# Patient Record
Sex: Female | Born: 2000 | Race: White | Hispanic: No | Smoking: Never smoker
Health system: Southern US, Community
[De-identification: ages and names within clinical notes are randomized; demographics above are authoritative.]

## PROBLEM LIST (undated history)

## (undated) DIAGNOSIS — D649 Anemia, unspecified: Secondary | ICD-10-CM

## (undated) DIAGNOSIS — O4440 Low lying placenta NOS or without hemorrhage, unspecified trimester: Secondary | ICD-10-CM

## (undated) DIAGNOSIS — F4010 Social phobia, unspecified: Secondary | ICD-10-CM

## (undated) DIAGNOSIS — K219 Gastro-esophageal reflux disease without esophagitis: Secondary | ICD-10-CM

## (undated) HISTORY — DX: Anemia, unspecified: D64.9

## (undated) HISTORY — DX: Social phobia, unspecified: F40.10

## (undated) HISTORY — PX: ADENOIDECTOMY AND MYRINGOTOMY WITH TUBE PLACEMENT: SHX5714

## (undated) HISTORY — DX: Gastro-esophageal reflux disease without esophagitis: K21.9

---
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
## 2021-01-15 DIAGNOSIS — Z3482 Encounter for supervision of other normal pregnancy, second trimester: Secondary | ICD-10-CM | POA: Diagnosis not present

## 2021-01-21 ENCOUNTER — Encounter: Payer: Self-pay | Admitting: Obstetrics and Gynecology

## 2021-01-21 ENCOUNTER — Observation Stay
Admission: EM | Admit: 2021-01-21 | Discharge: 2021-01-21 | Disposition: A | Discharge status: Home / Self Care | Payer: BC Managed Care – PPO | Attending: Obstetrics and Gynecology | Admitting: Obstetrics and Gynecology

## 2021-01-21 ENCOUNTER — Other Ambulatory Visit: Payer: Self-pay

## 2021-01-21 DIAGNOSIS — O09213 Supervision of pregnancy with history of pre-term labor, third trimester: Secondary | ICD-10-CM | POA: Diagnosis not present

## 2021-01-21 DIAGNOSIS — Z8616 Personal history of COVID-19: Secondary | ICD-10-CM | POA: Insufficient documentation

## 2021-01-21 DIAGNOSIS — O99013 Anemia complicating pregnancy, third trimester: Secondary | ICD-10-CM | POA: Diagnosis not present

## 2021-01-21 DIAGNOSIS — O4703 False labor before 37 completed weeks of gestation, third trimester: Secondary | ICD-10-CM | POA: Diagnosis present

## 2021-01-21 DIAGNOSIS — O26893 Other specified pregnancy related conditions, third trimester: Principal | ICD-10-CM | POA: Insufficient documentation

## 2021-01-21 DIAGNOSIS — Z3A28 28 weeks gestation of pregnancy: Secondary | ICD-10-CM | POA: Insufficient documentation

## 2021-01-21 DIAGNOSIS — Z8751 Personal history of pre-term labor: Secondary | ICD-10-CM | POA: Diagnosis not present

## 2021-01-21 DIAGNOSIS — O23593 Infection of other part of genital tract in pregnancy, third trimester: Secondary | ICD-10-CM | POA: Diagnosis not present

## 2021-01-21 LAB — URINALYSIS, COMPLETE (UACMP) WITH MICROSCOPIC
Bilirubin Urine: NEGATIVE
Glucose, UA: NEGATIVE mg/dL
Hgb urine dipstick: NEGATIVE
Leukocytes,Ua: NEGATIVE
Nitrite: NEGATIVE
Protein, ur: NEGATIVE mg/dL
Specific Gravity, Urine: 1.02 (ref 1.005–1.030)
pH: 7 (ref 5.0–8.0)

## 2021-01-21 LAB — CHLAMYDIA/NGC RT PCR (ARMC ONLY)
Chlamydia Tr: NOT DETECTED
N gonorrhoeae: NOT DETECTED

## 2021-01-21 LAB — WET PREP, GENITAL
Sperm: NONE SEEN
Trich, Wet Prep: NONE SEEN
Yeast Wet Prep HPF POC: NONE SEEN

## 2021-01-21 LAB — FETAL FIBRONECTIN: Fetal Fibronectin: NEGATIVE

## 2021-01-21 MED ORDER — METRONIDAZOLE 500 MG PO TABS: 500.0000 mg | ORAL_TABLET | Freq: Two times a day (BID) | ORAL | 0 refills | Status: DC

## 2021-01-21 MED ORDER — METRONIDAZOLE 500 MG PO TABS: 500.0000 mg | ORAL_TABLET | Freq: Two times a day (BID) | ORAL | Status: DC

## 2021-01-21 MED ORDER — ACETAMINOPHEN 325 MG PO TABS: 650.0000 mg | ORAL_TABLET | ORAL | Status: DC | PRN

## 2021-01-21 MED ORDER — METRONIDAZOLE 500 MG PO TABS
500.0000 mg | ORAL_TABLET | Freq: Two times a day (BID) | ORAL | Status: DC
  Administered 2021-01-21: 500 mg via ORAL
  Filled 2021-01-21: qty 1

## 2021-01-21 NOTE — Discharge Summary (Signed)
Nicole Mathews is a 20 y.o. female. She is at [redacted]w[redacted]d gestation. Patient's last menstrual period was 06/22/2020. Estimated Date of Delivery: 04/09/21  Prenatal care site: [facility]   Current pregnancy complicated by:  1. History of Preterm delivery at 36 weeks 2. Left adnexal tenderness at NOB- normal [place], no masses 3. COVID infection 11/2020  [place] for growth in 3rd trimester  Weekly NST at 36 weeks  4. Anemia pregnancy  01/15/2021 - hgb 10.8  Started on iron supplements   Chief complaint: c/o abdominal tightening that started last night and her mother states they are happening every 4-8 minutes but they are unsure if they are contractions. Pt denies VB, LOF and states positive FM.  S: Resting comfortably. no VB.no LOF,  Active fetal movement. 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   Supervision of normal first teen pregnancy 08/09/2018     Clinic [facility] Prenatal Labs Dating  10 wk [place] Blood type: O/Positive/-- (03/26 1500)  Genetic Screen NIPS:   Normal XX Antibody:Negative (03/26 1500) Anatomic [place]  complete, low lying placenta Resolved on follow up Rubella: 4.25 (03/26 1500) Varicella: non-immune GTT Early:   Not needed     28 wk:  137    RPR: Non Reactive (03/26 1500)  Rhogam Not needed HBsAg: Negative (03/26 1500)  TDaP vaccin    Past Surgical History:  Procedure Laterality Date   ADENOIDECTOMY AND MYRINGOTOMY WITH TUBE PLACEMENT Bilateral    age 70    No Known Allergies  Prior to Admission medications   Medication Sig Start Date End Date Taking? Authorizing  Provider  Prenatal Vit-Fe Fumarate-FA (PRENATAL MULTIVITAMIN) TABS tablet Take 1 tablet by mouth daily at 12 noon.   Yes [provider]      Social History: She  reports that she has never smoked. She has never used smokeless tobacco. She reports that she does not drink alcohol and does not use drugs.  Family History: family history includes Diabetes in her maternal grandmother; Endometriosis in her maternal aunt; Lung cancer (age of onset: 34) in her maternal grandfather.   Review of Systems: A full review of systems was performed and negative except as noted in the HPI.     O:  BP (!) 103/59   Pulse (!) 112   Temp 98.4 F (36.9 C) (Oral)   Resp 16   LMP 06/22/2020  Results for orders placed or performed during the hospital encounter of 01/21/21 (from the past 48 hour(s))  Wet prep, genital   Collection Time: 01/21/21  8:01 PM  Result Value Ref Range   Yeast Wet Prep HPF POC NONE SEEN NONE SEEN   Trich, Wet Prep NONE SEEN NONE SEEN   Clue Cells Wet Prep HPF POC PRESENT (A) NONE SEEN   WBC, Wet Prep HPF POC PRESENT (A) NONE SEEN   Sperm NONE SEEN   Fetal fibronectin   Collection Time: 01/21/21  8:01 PM  Result Value Ref Range   Fetal Fibronectin NEGATIVE NEGATIVE  Urinalysis, Complete w Microscopic   Collection Time: 01/21/21  8:01  PM  Result Value Ref Range   Color, Urine YELLOW (A) YELLOW   APPearance CLEAR (A) CLEAR   Specific Gravity, Urine 1.020 1.005 - 1.030   pH 7.0 5.0 - 8.0   Glucose, UA NEGATIVE NEGATIVE mg/dL   Hgb urine dipstick NEGATIVE NEGATIVE   Bilirubin Urine NEGATIVE NEGATIVE   Ketones, ur TRACE (A) NEGATIVE mg/dL   Protein, ur NEGATIVE NEGATIVE mg/dL   Nitrite NEGATIVE NEGATIVE   Leukocytes,Ua NEGATIVE NEGATIVE   RBC / HPF 0-5 0 - 5 RBC/hpf   WBC, UA 0-5 0 - 5 WBC/hpf   Bacteria, UA RARE (A) NONE SEEN   Squamous Epithelial / LPF 0-5 0 - 5   Mucus PRESENT    Hyaline Casts, UA PRESENT      Constitutional: NAD, AAOx3  HE/ENT:  extraocular movements grossly intact, moist mucous membranes CV: RRR PULM: nl respiratory effort, CTABL     Abd: gravid, non-tender, non-distended, soft      Ext: Non-tender, Nonedematous   Psych: mood appropriate, speech normal Pelvic:  SSE done: mod amount creamy yellow cervical mucus and vaginal discharge noted. Mild erythema, no bleeding but cervix appears friable.  SVE: cervix posterior, closed, but medium consistency.    TOCO: Irregular occasional UC noted with mild UI.   Fetal  monitoring: Cat I Appropriate for GA Baseline: 135bpm Variability: moderate Accelerations:  present x >2 Decelerations absent Time    A/P: 20 y.o. [redacted]w[redacted]d here for antenatal surveillance for preterm contractions  Principle Diagnosis:  preterm contractions, bacterial vaginosis  Preterm labor: not present. FFN Neg Fetal Wellbeing: Reassuring Cat 1 tracing with Reactive NST  Wet prep c/w BV, Flagyl Rx to pharmacy D/c home stable, precautions reviewed, follow-up as scheduled.    Randa Ngo, CNM 01/21/2021  9:15 PM

## 2021-01-21 NOTE — OB Triage Note (Signed)
Pt discharged to home and left unit ambulatory with her mom at side and all personal belongs. Pt to be driven home in private passenger vehicle. OB triage complete. Pt encouraged to keep all remaining outpatient visits and get meds from pharmacy.

## 2021-01-21 NOTE — OB Triage Note (Signed)
Pt is a 20yo G2P1 at [redacted]w[redacted]d that presents from ED with c/o abdominal tightening that started last night and her mother states they are happening every 4-8 minutes but they are unsure if they are contractions. Pt denies VB, LOF and states positive FM. EFM applied and and intial fht 155. Pt states she had a preterm delivery at 36w with her previous pregnancy and is unsure of what ctx feel like because she had an epidural early.

## 2021-02-10 ENCOUNTER — Observation Stay
Admission: EM | Admit: 2021-02-10 | Discharge: 2021-02-10 | Disposition: A | Discharge status: Home / Self Care | Payer: BC Managed Care – PPO | Attending: Obstetrics and Gynecology | Admitting: Obstetrics and Gynecology

## 2021-02-10 DIAGNOSIS — Z3A31 31 weeks gestation of pregnancy: Secondary | ICD-10-CM | POA: Diagnosis not present

## 2021-02-10 DIAGNOSIS — O4703 False labor before 37 completed weeks of gestation, third trimester: Principal | ICD-10-CM | POA: Insufficient documentation

## 2021-02-10 DIAGNOSIS — O09213 Supervision of pregnancy with history of pre-term labor, third trimester: Secondary | ICD-10-CM | POA: Diagnosis not present

## 2021-02-10 LAB — URINALYSIS, COMPLETE (UACMP) WITH MICROSCOPIC
Bilirubin Urine: NEGATIVE
Glucose, UA: NEGATIVE mg/dL
Hgb urine dipstick: NEGATIVE
Ketones, ur: NEGATIVE mg/dL
Leukocytes,Ua: NEGATIVE
Nitrite: NEGATIVE
Protein, ur: NEGATIVE mg/dL
Specific Gravity, Urine: 1.001 — ABNORMAL LOW (ref 1.005–1.030)
pH: 8 (ref 5.0–8.0)

## 2021-02-10 MED ORDER — TERBUTALINE SULFATE 1 MG/ML IJ SOLN
0.2500 mg | Freq: Once | INTRAMUSCULAR | Status: AC
  Administered 2021-02-10: 0.25 mg via SUBCUTANEOUS
  Filled 2021-02-10: qty 1

## 2021-02-10 NOTE — Discharge Summary (Signed)
Patient ID: Nicole Mathews, female   DOB: August 30, 2000, 20 y.o.   MRN: [id number]   Subjective [] Expand by Default  Nicole Mathews is a 20 y.o. female. She is at [redacted]w[redacted]d gestation. Patient's last menstrual period was 06/22/2020. Estimated Date of Delivery: 04/09/21   Prenatal care site: [facility]   Chief complaint:ctx q 7-10 minutes  apart . No LOF , no vaginal bleeding  Started this am . + intercourse yesterday . PO intake good .  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      during puberty   Supervision of normal first teen pregnancy 08/09/2018      Clinic  [facility]            Prenatal Labs Dating     10 wk 08/11/2018        Blood type: O/Positive/-- (03/26 1500)  Genetic Screen     NIPS:   Normal XX    Antibody:Negative (03/26 1500) Anatomic 07-29-1999     complete, low lying placenta Resolved on follow up Rubella: 4.25 (03/26 1500) Varicella: non-immune GTT         Early:   Not needed     28 wk:  137              RPR: Non Reactive (03/26 1500)  Rhogam            Not needed           HBsAg: Negative (03/26 1500)  TDaP vaccin           Past Surgical History:  Procedure Laterality Date   ADENOIDECTOMY AND MYRINGOTOMY WITH TUBE PLACEMENT Bilateral      age 51      No Known Allergies          Prior to Admission medications   Medication Sig Start Date End Date Taking? Authorizing Provider  acetaminophen (TYLENOL) 325 MG tablet Take 2 tablets (650 mg total) by mouth every 4 (four) hours as needed (for pain scale < 4  OR  temperature  >/=  100.5 F). 01/21/21     McVey, 03/23/21 A, CNM  metroNIDAZOLE (FLAGYL) 500 MG  tablet Take 1 tablet (500 mg total) by mouth every 12 (twelve) hours. 01/22/21     McVey, 03/24/21, CNM  Prenatal Vit-Fe Fumarate-FA (PRENATAL MULTIVITAMIN) TABS tablet Take 1 tablet by mouth daily at 12 noon.       [provider]        Social History: She  reports that she has never smoked. She has never used smokeless tobacco. She reports that she does not drink alcohol and does not use drugs.   Family History: family history includes Diabetes in her maternal grandmother; Endometriosis in her maternal aunt; Lung cancer (age of onset: 54) in her maternal grandfather.  no history of gyn cancers   Review of Systems: A full review of systems was performed and negative except as noted in the HPI.       O:  Objective   LMP 06/22/2020  No results found for this or any previous visit (from the past 48 hour(s)).     Constitutional: NAD, AAOx3  HE/ENT: extraocular movements grossly intact, moist mucous membranes CV: RRR PULM: nl respiratory effort, CTABL                                         Abd: gravid, non-tender, non-distended, soft                                                  Ext: Non-tender, Nonedmeatous                     Psych: mood appropriate, speech normal Pelvic cx closed / 30% / -1 station  NST:  Baseline: 150 Variability: moderate Accelerations present x >2 Decelerations absent Ctx 2 isolated in 45 min   Lab Results Last 24 Hours       Results for orders placed or performed during the hospital encounter of 02/10/21 (from the past 24 hour(s))  Urinalysis, Complete w Microscopic Urine, Clean Catch     Status: Abnormal    Collection Time: 02/10/21  6:22 PM  Result Value Ref Range    Color, Urine STRAW (A) YELLOW    APPearance CLEAR (A) CLEAR    Specific Gravity, Urine 1.001 (L) 1.005 - 1.030    pH 8.0 5.0 - 8.0    Glucose, UA NEGATIVE NEGATIVE mg/dL    Hgb urine dipstick NEGATIVE NEGATIVE    Bilirubin Urine NEGATIVE NEGATIVE    Ketones, ur  NEGATIVE NEGATIVE mg/dL    Protein, ur NEGATIVE NEGATIVE mg/dL    Nitrite NEGATIVE NEGATIVE    Leukocytes,Ua NEGATIVE NEGATIVE    WBC, UA 0-5 0 - 5 WBC/hpf    Bacteria, UA RARE (A) NONE SEEN    Squamous Epithelial / LPF 0-5 0 - 5    Mucus PRESENT          Assessment: 20 y.o. [redacted]w[redacted]d here for antenatal surveillance during pregnancy.   Principle diagnosis: preterm ctx . No cervical dilation      Plan: SQ terb x 1 given  Labor: not present.  Fetal Wellbeing: Reassuring Cat 1 tracing. Reactive NST  D/c home stable, precautions reviewed, follow-up as scheduled.  RTC if >5 ctx / hr . Appt AT [facility] in 2 days . Cx check then    ----- T Wrenna Saks MD Attending Obstetrician and Gynecologist [facility], Department of OB/GYN [facility]

## 2021-02-10 NOTE — Progress Notes (Addendum)
Patient ID: ZEINAB RODWELL, female   DOB: 2000-08-29, 20 y.o.   MRN: [id number]  MARGART ZEMANEK is a 20 y.o. female. She is at [redacted]w[redacted]d gestation. Patient's last menstrual period was 06/22/2020. Estimated Date of Delivery: 04/09/21  Prenatal care site: [facility]   Chief complaint:ctx q 7-10 minutes  apart . No LOF , no vaginal bleeding  Started this am . + intercourse yesterday . PO intake good .  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   Supervision of normal first teen pregnancy 08/09/2018     Clinic [facility] Prenatal Labs Dating  10 wk [place] Blood type: O/Positive/-- (03/26 1500)  Genetic Screen NIPS:   Normal XX Antibody:Negative (03/26 1500) Anatomic [place]  complete, low lying placenta Resolved on follow up Rubella: 4.25 (03/26 1500) Varicella: non-immune GTT Early:   Not needed     28 wk:  137    RPR: Non Reactive (03/26 1500)  Rhogam Not needed HBsAg: Negative (03/26 1500)  TDaP vaccin    Past Surgical History:  Procedure Laterality Date   ADENOIDECTOMY AND MYRINGOTOMY WITH TUBE PLACEMENT Bilateral    age 58    No Known Allergies  Prior to Admission medications   Medication Sig Start Date End Date Taking? Authorizing Provider  acetaminophen (TYLENOL) 325 MG tablet Take 2 tablets (650 mg total) by mouth every 4 (four) hours as needed (for pain scale < 4  OR  temperature  >/=  100.5 F). 01/21/21   McVey, Lurena Joiner A, CNM  metroNIDAZOLE (FLAGYL) 500 MG tablet Take 1 tablet (500 mg total) by mouth every 12 (twelve) hours. 01/22/21   McVey, Prudencio Pair, CNM  Prenatal Vit-Fe Fumarate-FA (PRENATAL  MULTIVITAMIN) TABS tablet Take 1 tablet by mouth daily at 12 noon.    [provider]     Social History: She  reports that she has never smoked. She has never used smokeless tobacco. She reports that she does not drink alcohol and does not use drugs.  Family History: family history includes Diabetes in her maternal grandmother; Endometriosis in her maternal aunt; Lung cancer (age of onset: 45) in her maternal grandfather.  no history of gyn cancers  Review of Systems: A full review of systems was performed and negative except as noted in the HPI.     O:  LMP 06/22/2020  No results found for this or any previous visit (from the past 48 hour(s)).   Constitutional: NAD, AAOx3  HE/ENT: extraocular movements grossly intact, moist mucous membranes CV: RRR PULM: nl respiratory effort, CTABL     Abd: gravid, non-tender, non-distended, soft      Ext: Non-tender, Nonedmeatous   Psych: mood appropriate, speech normal Pelvic cx closed / 30% / -1 station  NST:  Baseline: 150 Variability: moderate Accelerations present x >2 Decelerations absent Ctx 2 isolated in 45 min  Results for orders placed or performed during the hospital encounter of 02/10/21 (from the past 24 hour(s))  Urinalysis, Complete w Microscopic Urine, Clean Catch     Status: Abnormal   Collection Time: 02/10/21  6:22 PM  Result Value Ref Range   Color, Urine STRAW (A) YELLOW   APPearance CLEAR (A) CLEAR   Specific Gravity, Urine 1.001 (L) 1.005 - 1.030   pH 8.0 5.0 - 8.0   Glucose, UA NEGATIVE NEGATIVE mg/dL   Hgb urine dipstick NEGATIVE NEGATIVE   Bilirubin Urine NEGATIVE NEGATIVE   Ketones, ur NEGATIVE NEGATIVE mg/dL   Protein, ur NEGATIVE NEGATIVE mg/dL   Nitrite NEGATIVE NEGATIVE   Leukocytes,Ua NEGATIVE NEGATIVE   WBC, UA 0-5 0 - 5 WBC/hpf   Bacteria, UA RARE (A) NONE SEEN   Squamous Epithelial / LPF 0-5 0 - 5   Mucus PRESENT      Assessment: 20 y.o. [redacted]w[redacted]d here for antenatal surveillance during  pregnancy.  Principle diagnosis: preterm ctx . No cervical dilation    Plan: SQ terb x 1 given  Labor: not present.  Fetal Wellbeing: Reassuring Cat 1 tracing. Reactive NST  D/c home stable, precautions reviewed, follow-up as scheduled.  RTC if >5 ctx / hr . Appt AT [facility] in 2 days . Cx check then   ----- T Corban Kistler MD Attending Obstetrician and Gynecologist [facility], Department of OB/GYN [facility]

## 2021-02-10 NOTE — OB Triage Note (Signed)
Here for complaints of contractions . Denies bleeding or leaking of fluid. Dr. Feliberto Gottron in room  SVE done Cervix closed/30/-1.

## 2021-02-10 NOTE — OB Triage Note (Signed)
Discharge instructions, labor precautions, and follow-up care reviewed with patient and family member. All questions answered. Patient verbalized understanding. Discharged ambulatory off the unit.

## 2021-02-11 ENCOUNTER — Observation Stay
Admission: EM | Admit: 2021-02-11 | Discharge: 2021-02-11 | Disposition: A | Discharge status: Home / Self Care | Payer: BC Managed Care – PPO | Attending: Obstetrics | Admitting: Obstetrics

## 2021-02-11 ENCOUNTER — Other Ambulatory Visit: Payer: Self-pay

## 2021-02-11 ENCOUNTER — Encounter: Payer: Self-pay | Admitting: Obstetrics and Gynecology

## 2021-02-11 ENCOUNTER — Telehealth: Payer: Self-pay

## 2021-02-11 DIAGNOSIS — Z3490 Encounter for supervision of normal pregnancy, unspecified, unspecified trimester: Secondary | ICD-10-CM

## 2021-02-11 DIAGNOSIS — O09213 Supervision of pregnancy with history of pre-term labor, third trimester: Secondary | ICD-10-CM | POA: Diagnosis not present

## 2021-02-11 DIAGNOSIS — O479 False labor, unspecified: Secondary | ICD-10-CM | POA: Diagnosis present

## 2021-02-11 DIAGNOSIS — Z3A31 31 weeks gestation of pregnancy: Secondary | ICD-10-CM | POA: Insufficient documentation

## 2021-02-11 MED ORDER — LACTATED RINGERS IV BOLUS
1000.0000 mL | Freq: Once | INTRAVENOUS | Status: AC
  Administered 2021-02-11: 1000 mL via INTRAVENOUS

## 2021-02-11 NOTE — OB Triage Note (Signed)
[facility] with complaints of ctx. Pt is [redacted]w[redacted]d, G2P1. Pt states she has ctx with activity since last night. Pt states she is having lower abdominal pain and ctx that vary with intensity/when they occur. Pt has a hx of preterm labor. Pt rates pain 5/10. Pt denies LOF and vaginal bleeding. Pt states positive FM. Monitors applied and assessing. Initial FHT 150.

## 2021-02-11 NOTE — Telephone Encounter (Signed)
Transition Care Management Follow-up Telephone Call Date of discharge and from where: 02/11/2021-[facility] How have you been since you were released from the hospital? Patient stated she is doing ok but her contractions are starting again so she will go back to the ED to make sure everything is ok.  Any questions or concerns? No  Items Reviewed: Did the pt receive and understand the discharge instructions provided? Yes  Medications obtained and verified?  N/A Other? No  Any new allergies since your discharge? No  Dietary orders reviewed? N/A Do you have support at home? Yes   Home Care and Equipment/Supplies: Were home health services ordered? not applicable If so, what is the name of the agency? N/A  Has the agency set up a time to come to the patient's home? not applicable Were any new equipment or medical supplies ordered?  No What is the name of the medical supply agency? N/A Were you able to get the supplies/equipment? not applicable Do you have any questions related to the use of the equipment or supplies? No  Functional Questionnaire: (I = Independent and D = Dependent) ADLs: I  Bathing/Dressing- I  Meal Prep- I  Eating- I  Maintaining continence- I  Transferring/Ambulation- I  Managing Meds- I  Follow up appointments reviewed:  PCP Hospital f/u appt confirmed? No   Specialist Hospital f/u appt confirmed? Yes  Scheduled to see Dr. Feliberto Gottron on 02/12/2021 @ 2:30pm. Are transportation arrangements needed? No  If their condition worsens, is the pt aware to call PCP or go to the Emergency Dept.? Yes Was the patient provided with contact information for the PCP's office or ED? Yes Was to pt encouraged to call back with questions or concerns? Yes

## 2021-02-11 NOTE — OB Triage Note (Signed)
Pt discharged home in stable condition. RN provided discharge instructions to pt, including information on follow up care. Pt verbalized understanding and all questions answered at this time.

## 2021-02-11 NOTE — Discharge Instructions (Signed)

## 2021-02-11 NOTE — Discharge Summary (Addendum)
Nicole Mathews is a 20 y.o. female. She is at [redacted]w[redacted]d gestation. Patient's last menstrual period was 06/22/2020. Estimated Date of Delivery: 04/09/21  Prenatal care site: [facility]  Chief complaint: ctx q 5-8 mins apart  HPI: Nicole Mathews presents to L&D with complaints of ctx every 5- 8 mins, with increased intensity.  Factors complicating pregnancy: History of preterm delivery at 36 weeks Left sided adnexal tenderness at NOB  S: Resting comfortably.  no VB.no LOF,  Active fetal movement.   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    Past Surgical Hx:  has a past surgical history that includes Adenoidectomy and myringotomy with tube placement (Bilateral).   No Known Allergies   Prior to Admission medications   Medication Sig Start Date End Date Taking? Authorizing Provider  acetaminophen (TYLENOL) 325 MG tablet Take 2 tablets (650 mg total) by mouth every 4 (four) hours as needed (for pain scale < 4  OR  temperature  >/=  100.5 F). 01/21/21   McVey, Lurena Joiner A, CNM  metroNIDAZOLE (FLAGYL) 500 MG tablet Take 1 tablet (500 mg total) by mouth every 12 (twelve) hours. Patient not taking: Reported on 02/11/2021 01/22/21   McVey, Prudencio Pair, CNM  Prenatal Vit-Fe Fumarate-FA (PRENATAL MULTIVITAMIN) TABS tablet Take 1 tablet by mouth daily at 12 noon.    [provider]    Social History: She  reports that she has never smoked. She has never used smokeless tobacco. She reports that she does not drink alcohol and does not use drugs.  Family History: family history includes Diabetes in her  maternal grandmother; Endometriosis in her maternal aunt; Lung cancer (age of onset: 81) in her maternal grandfather. ,no history of gyn cancers  Review of Systems: A full review of systems was performed and negative except as noted in the HPI.    O:  BP 106/68 (BP Location: Left Arm)   Pulse (!) 115   Temp 98 F (36.7 C) (Oral)   Resp 14   LMP 06/22/2020  Results for orders placed or performed during the hospital encounter of 02/10/21 (from the past 48 hour(s))  Urinalysis, Complete w Microscopic Urine, Clean Catch   Collection Time: 02/10/21  6:22 PM  Result Value Ref Range   Color, Urine STRAW (A) YELLOW   APPearance CLEAR (A) CLEAR   Specific Gravity, Urine 1.001 (L) 1.005 - 1.030   pH 8.0 5.0 - 8.0   Glucose, UA NEGATIVE NEGATIVE mg/dL   Hgb urine dipstick NEGATIVE NEGATIVE   Bilirubin Urine NEGATIVE NEGATIVE   Ketones, ur NEGATIVE NEGATIVE mg/dL   Protein, ur NEGATIVE NEGATIVE mg/dL   Nitrite NEGATIVE NEGATIVE   Leukocytes,Ua NEGATIVE NEGATIVE   WBC, UA 0-5 0 - 5 WBC/hpf   Bacteria, UA RARE (A) NONE SEEN   Squamous Epithelial / LPF 0-5 0 - 5   Mucus PRESENT       Constitutional: NAD, AAOx3  HE/ENT: extraocular movements grossly intact, moist mucous membranes CV: RRR PULM: nl respiratory effort, CTABL Abd: gravid, non-tender, non-distended, soft  Ext: Non-tender, Nonedmeatous Psych: mood appropriate, speech normal Pelvic : deferred SVE: Dilation: Closed Effacement (%): 30 Station: -3 Exam by:: [employer]  Fetal Monitor: Baseline: 140 bpm Variability: moderate Accels: Present Decels: none Toco: irregular, every 5-8 minutes, irritability, palpating mild.   Category: I With midwife at bedside patient denied feeling contractions and states she feels better after receiving IV fluid.   Assessment: 20 y.o. [redacted]w[redacted]d here for antenatal surveillance during pregnancy.  Principle diagnosis: uterine contractions There were no encounter diagnoses.   Plan: 1  liter IV fluid bolus  Labor: not present.  Fetal Wellbeing: Reassuring Cat 1 tracing. Reactive NST  D/c home stable, precautions reviewed, follow-up as scheduled.   ----- Chari Manning CNM Certified Nurse Midwife [facility]

## 2021-02-12 ENCOUNTER — Telehealth: Payer: Self-pay | Admitting: *Deleted

## 2021-02-12 NOTE — Telephone Encounter (Signed)
Transition Care Management Follow-up Telephone Call Date of discharge and from where: 02/11/2021 [facility] How have you been since you were released from the hospital? "Doing okay" Any questions or concerns? No  Items Reviewed: Did the pt receive and understand the discharge instructions provided? Yes  Medications obtained and verified? Yes  Other? No  Any new allergies since your discharge? No  Dietary orders reviewed? No Do you have support at home? Yes    Functional Questionnaire: (I = Independent and D = Dependent) ADLs: I  Bathing/Dressing- I  Meal Prep- I  Eating- I  Maintaining continence- I  Transferring/Ambulation- I  Managing Meds- I  Follow up appointments reviewed:  PCP Hospital f/u appt confirmed? No   Specialist Hospital f/u appt confirmed? No   Are transportation arrangements needed? No  If their condition worsens, is the pt aware to call PCP or go to the Emergency Dept.? Yes Was the patient provided with contact information for the PCP's office or ED? Yes Was to pt encouraged to call back with questions or concerns? Yes

## 2021-03-03 ENCOUNTER — Other Ambulatory Visit: Payer: Self-pay | Admitting: Certified Nurse Midwife

## 2021-03-03 ENCOUNTER — Other Ambulatory Visit: Payer: Self-pay

## 2021-03-03 ENCOUNTER — Observation Stay
Admission: EM | Admit: 2021-03-03 | Discharge: 2021-03-03 | Disposition: A | Discharge status: Home / Self Care | Payer: BC Managed Care – PPO | Attending: Certified Nurse Midwife | Admitting: Certified Nurse Midwife

## 2021-03-03 ENCOUNTER — Encounter: Payer: Self-pay | Admitting: Obstetrics and Gynecology

## 2021-03-03 DIAGNOSIS — Z3A34 34 weeks gestation of pregnancy: Secondary | ICD-10-CM | POA: Insufficient documentation

## 2021-03-03 DIAGNOSIS — O4703 False labor before 37 completed weeks of gestation, third trimester: Principal | ICD-10-CM | POA: Insufficient documentation

## 2021-03-03 DIAGNOSIS — O479 False labor, unspecified: Secondary | ICD-10-CM | POA: Diagnosis present

## 2021-03-03 LAB — URINALYSIS, COMPLETE (UACMP) WITH MICROSCOPIC
Bilirubin Urine: NEGATIVE
Glucose, UA: NEGATIVE mg/dL
Hgb urine dipstick: NEGATIVE
Ketones, ur: NEGATIVE mg/dL
Leukocytes,Ua: NEGATIVE
Nitrite: NEGATIVE
Protein, ur: NEGATIVE mg/dL
Specific Gravity, Urine: 1.005 (ref 1.005–1.030)
WBC, UA: NONE SEEN WBC/hpf (ref 0–5)
pH: 7 (ref 5.0–8.0)

## 2021-03-03 LAB — FETAL FIBRONECTIN: Fetal Fibronectin: NEGATIVE

## 2021-03-03 MED ORDER — DOCUSATE SODIUM 100 MG PO CAPS
100.0000 mg | ORAL_CAPSULE | Freq: Every day | ORAL | Status: DC
Start: 2021-03-03 — End: 2021-03-04

## 2021-03-03 MED ORDER — ACETAMINOPHEN 325 MG PO TABS: 650.0000 mg | ORAL_TABLET | ORAL | Status: DC | PRN

## 2021-03-03 MED ORDER — LACTATED RINGERS IV BOLUS
1000.0000 mL | Freq: Once | INTRAVENOUS | Status: AC
  Administered 2021-03-03: 1000 mL via INTRAVENOUS

## 2021-03-03 MED ORDER — PRENATAL MULTIVITAMIN CH: 1.0000 | ORAL_TABLET | Freq: Every day | ORAL | Status: DC

## 2021-03-03 MED ORDER — CALCIUM CARBONATE ANTACID 500 MG PO CHEW: 2.0000 | CHEWABLE_TABLET | ORAL | Status: DC | PRN

## 2021-03-03 NOTE — OB Triage Note (Signed)
Discharge instructions, labor precautions, and follow-up care reviewed with patient and significant other. All questions answered. Patient verbalized understanding. Patient discharged ambulatory off the unit.

## 2021-03-03 NOTE — Discharge Summary (Signed)
Nicole Mathews is a 20 y.o. female. She is at [redacted]w[redacted]d gestation. Patient's last menstrual period was 06/22/2020. Estimated Date of Delivery: 04/09/21  Prenatal care site: [facility]   Current pregnancy complicated by:  History of preterm delivery at 36 weeks Left sided adnexal tenderness at [facility]  Chief complaint: contractions  She reports persistent contractions all week that are getting stronger tonight. She reports she drank "at least a gallon" of water today and was not very active today. She reports nothing is relieving her contractions.  S: Resting comfortably. CTX present, she is not wincing or breathing through the contractions, no VB.no LOF,  Active fetal movement. 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   Supervision of normal first teen pregnancy 08/09/2018     Clinic [facility] Prenatal Labs Dating  10 wk [place] Blood type: O/Positive/-- (03/26 1500)  Genetic Screen NIPS:   Normal XX Antibody:Negative (03/26 1500) Anatomic [place]  complete, low lying placenta Resolved on follow up Rubella: 4.25 (03/26 1500) Varicella: non-immune GTT Early:   Not needed     28 wk:  137    RPR: Non Reactive (03/26 1500)  Rhogam Not needed HBsAg: Negative (03/26 1500)  TDaP vaccin    Past Surgical History:  Procedure Laterality Date   ADENOIDECTOMY AND MYRINGOTOMY WITH TUBE PLACEMENT Bilateral    age 26    No Known Allergies  Prior to Admission medications   Medication Sig Start Date End Date Taking? Authorizing Provider  ferrous sulfate 324 MG TBEC Take 324 mg by mouth.   Yes [provider]  Prenatal Vit-Fe Fumarate-FA (PRENATAL MULTIVITAMIN) TABS tablet Take 1 tablet by mouth daily at 12 noon.   Yes [provider]  acetaminophen (TYLENOL) 325 MG tablet Take 2 tablets (650 mg total) by mouth every 4 (four) hours as needed (for pain scale < 4  OR  temperature  >/=  100.5 F). Patient not taking: Reported on 03/03/2021 01/21/21   McVey, Prudencio Pair, CNM  metroNIDAZOLE (FLAGYL) 500 MG tablet Take 1 tablet (500 mg total) by mouth every 12 (twelve) hours. Patient not taking: No sig reported 01/22/21   McVey, Prudencio Pair, CNM      Social History: She  reports that she has never smoked. She has never used smokeless tobacco. She reports that she does not drink alcohol and does not use drugs.  Family History: family history includes Diabetes in her maternal grandmother; Endometriosis in her maternal aunt; Lung cancer (age of onset: 61) in her maternal grandfather.  no history of gyn cancers  Review of Systems: A full review of systems was performed and negative except as noted in the HPI.     O:  BP 108/68 (BP Location: Right Arm)   Pulse (!) 105   Temp 98.7 F (37.1 C) (Oral)   Resp 16   Ht 5\' 1"  (1.549 m)   Wt 54.4 kg   LMP 06/22/2020   BMI 22.67 kg/m  Results for orders placed or performed during the hospital encounter of 03/03/21 (from the past 48 hour(s))  Urinalysis,  Complete w Microscopic Urine, Clean Catch   Collection Time: 03/03/21  7:21 PM  Result Value Ref Range   Color, Urine COLORLESS (A) YELLOW   APPearance CLEAR (A) CLEAR   Specific Gravity, Urine 1.005 1.005 - 1.030   pH 7.0 5.0 - 8.0   Glucose, UA NEGATIVE NEGATIVE mg/dL   Hgb urine dipstick NEGATIVE NEGATIVE   Bilirubin Urine NEGATIVE NEGATIVE   Ketones, ur NEGATIVE NEGATIVE mg/dL   Protein, ur NEGATIVE NEGATIVE mg/dL   Nitrite NEGATIVE NEGATIVE   Leukocytes,Ua NEGATIVE NEGATIVE   RBC / HPF 0-5 0 - 5 RBC/hpf   WBC, UA NONE SEEN 0 - 5 WBC/hpf   Bacteria, UA RARE (A) NONE SEEN    Squamous Epithelial / LPF 0-5 0 - 5   Hyaline Casts, UA PRESENT   Fetal fibronectin   Collection Time: 03/03/21  7:24 PM  Result Value Ref Range   Fetal Fibronectin NEGATIVE NEGATIVE      Constitutional: NAD, AAOx3  HE/ENT: extraocular movements grossly intact, moist mucous membranes CV: RRR PULM: nl respiratory effort, CTABL     Abd: gravid, non-tender, non-distended, soft      Ext: Non-tender, Nonedematous   Psych: mood appropriate, speech normal Pelvic: SVE closed/50%/-3  Pelvic exam: normal external genitalia, vulva, vagina, cervix, uterus and adnexa.  Fetal  monitoring: Cat 1 Appropriate for GA Baseline: 135bpm Variability: moderate Accelerations: present x >2 Decelerations absent Time Contractions: 5-8 minutes with uterine irritability noted   A/P: 20 y.o. [redacted]w[redacted]d here for antenatal surveillance for uterine contractions  Principle Diagnosis:  Normal pregnancy in third trimester  Labor: not present, contractions spaced out to 7-5min apart, irritability decreased with IVF bolus, FFN negative, cervix closed.  Fetal Wellbeing: Reassuring Cat 1 tracing. Reactive NST  Increase hydration and rest when home Reviewed preterm labor precautions D/c home stable, precautions reviewed, follow-up as scheduled.    [name], CNM 03/03/2021 10:06 PM

## 2021-03-03 NOTE — OB Triage Note (Signed)
Contractions since Saturday, irregular, less than 6 an hour. Denies vaginal bleeding, LOF, increase in vaginal discharge. Reports + fetal movement. Denies urinary complaints. Elaina Hoops

## 2021-03-04 ENCOUNTER — Telehealth: Payer: Self-pay

## 2021-03-04 NOTE — Telephone Encounter (Signed)
Transition Care Management Unsuccessful Follow-up Telephone Call  Date of discharge and from where:  03/03/2021 from [facility]  Attempts:  1st Attempt  Reason for unsuccessful TCM follow-up call:  Left voice message    

## 2021-03-05 NOTE — Telephone Encounter (Signed)
Transition Care Management Unsuccessful Follow-up Telephone Call  Date of discharge and from where:  03/03/2021 from [facility]  Attempts:  2nd Attempt  Reason for unsuccessful TCM follow-up call:  Left voice message    

## 2021-03-06 NOTE — Telephone Encounter (Signed)
Transition Care Management Follow-up Telephone Call Date of discharge and from where: 03/03/2021 from [facility] How have you been since you were released from the hospital? Pt stated that she is feeling good. Pt did not have any questions or concerns at this time.  Any questions or concerns? No  Items Reviewed: Did the pt receive and understand the discharge instructions provided? Yes  Medications obtained and verified? Yes  Other? No  Any new allergies since your discharge? No  Dietary orders reviewed? No Do you have support at home? Yes   Functional Questionnaire: (I = Independent and D = Dependent) ADLs: I  Bathing/Dressing- I  Meal Prep- I  Eating- I  Maintaining continence- I  Transferring/Ambulation- I  Managing Meds- I   Follow up appointments reviewed:  PCP Hospital f/u appt confirmed? No  Specialist Hospital f/u appt confirmed? Yes  Scheduled to see Jennell Corner, MD on 03/15/2021 @ 1:30pm. Are transportation arrangements needed? No  If their condition worsens, is the pt aware to call PCP or go to the Emergency Dept.? Yes Was the patient provided with contact information for the PCP's office or ED? Yes Was to pt encouraged to call back with questions or concerns? Yes

## 2021-03-10 ENCOUNTER — Inpatient Hospital Stay: Payer: BC Managed Care – PPO | Admitting: Anesthesiology

## 2021-03-10 ENCOUNTER — Encounter: Payer: Self-pay | Admitting: Obstetrics and Gynecology

## 2021-03-10 ENCOUNTER — Other Ambulatory Visit: Payer: Self-pay

## 2021-03-10 ENCOUNTER — Inpatient Hospital Stay
Admission: EM | Admit: 2021-03-10 | Discharge: 2021-03-12 | DRG: 807 | Disposition: A | Discharge status: Home / Self Care | Payer: BC Managed Care – PPO | Attending: Obstetrics | Admitting: Obstetrics

## 2021-03-10 ENCOUNTER — Observation Stay
Admission: EM | Admit: 2021-03-10 | Discharge: 2021-03-10 | Disposition: A | Discharge status: Home / Self Care | Payer: BC Managed Care – PPO | Source: Home / Self Care | Admitting: Obstetrics and Gynecology

## 2021-03-10 DIAGNOSIS — O479 False labor, unspecified: Secondary | ICD-10-CM | POA: Diagnosis present

## 2021-03-10 DIAGNOSIS — Z8616 Personal history of COVID-19: Secondary | ICD-10-CM

## 2021-03-10 DIAGNOSIS — Z20822 Contact with and (suspected) exposure to covid-19: Secondary | ICD-10-CM | POA: Diagnosis present

## 2021-03-10 DIAGNOSIS — O9902 Anemia complicating childbirth: Secondary | ICD-10-CM | POA: Diagnosis present

## 2021-03-10 DIAGNOSIS — Z3A35 35 weeks gestation of pregnancy: Secondary | ICD-10-CM

## 2021-03-10 DIAGNOSIS — O4703 False labor before 37 completed weeks of gestation, third trimester: Secondary | ICD-10-CM | POA: Insufficient documentation

## 2021-03-10 DIAGNOSIS — D509 Iron deficiency anemia, unspecified: Secondary | ICD-10-CM | POA: Diagnosis present

## 2021-03-10 LAB — URINALYSIS, ROUTINE W REFLEX MICROSCOPIC
Bilirubin Urine: NEGATIVE
Glucose, UA: NEGATIVE mg/dL
Hgb urine dipstick: NEGATIVE
Ketones, ur: NEGATIVE mg/dL
Leukocytes,Ua: NEGATIVE
Nitrite: NEGATIVE
Protein, ur: NEGATIVE mg/dL
Specific Gravity, Urine: 1.003 — ABNORMAL LOW (ref 1.005–1.030)
pH: 7 (ref 5.0–8.0)

## 2021-03-10 LAB — CBC
HCT: 36.8 % (ref 36.0–46.0)
Hemoglobin: 12.9 g/dL (ref 12.0–15.0)
MCH: 32.7 pg (ref 26.0–34.0)
MCHC: 35.1 g/dL (ref 30.0–36.0)
MCV: 93.2 fL (ref 80.0–100.0)
Platelets: 170 10*3/uL (ref 150–400)
RBC: 3.95 MIL/uL (ref 3.87–5.11)
RDW: 12.9 % (ref 11.5–15.5)
WBC: 11.4 10*3/uL — ABNORMAL HIGH (ref 4.0–10.5)
nRBC: 0 % (ref 0.0–0.2)

## 2021-03-10 LAB — RESP PANEL BY RT-PCR (FLU A&B, COVID) ARPGX2
Influenza A by PCR: NEGATIVE
Influenza B by PCR: NEGATIVE
SARS Coronavirus 2 by RT PCR: NEGATIVE

## 2021-03-10 LAB — GROUP B STREP BY PCR: Group B strep by PCR: NEGATIVE

## 2021-03-10 LAB — WET PREP, GENITAL
Clue Cells Wet Prep HPF POC: NONE SEEN
Sperm: NONE SEEN
Trich, Wet Prep: NONE SEEN
Yeast Wet Prep HPF POC: NONE SEEN

## 2021-03-10 MED ORDER — PRENATAL MULTIVITAMIN CH: 1.0000 | ORAL_TABLET | Freq: Every day | ORAL | Status: DC

## 2021-03-10 MED ORDER — PHENYLEPHRINE 40 MCG/ML (10ML) SYRINGE FOR IV PUSH (FOR BLOOD PRESSURE SUPPORT)
80.0000 ug | PREFILLED_SYRINGE | INTRAVENOUS | Status: DC | PRN
Start: 2021-03-10 — End: 2021-03-10

## 2021-03-10 MED ORDER — LACTATED RINGERS IV BOLUS
1000.0000 mL | Freq: Once | INTRAVENOUS | Status: AC
  Administered 2021-03-10: 1000 mL via INTRAVENOUS

## 2021-03-10 MED ORDER — AMPICILLIN SODIUM 2 G IJ SOLR
2.0000 g | Freq: Once | INTRAMUSCULAR | Status: AC
  Administered 2021-03-10: 2 g via INTRAVENOUS
  Filled 2021-03-10: qty 2000

## 2021-03-10 MED ORDER — FENTANYL CITRATE (PF) 100 MCG/2ML IJ SOLN
50.0000 ug | INTRAMUSCULAR | Status: DC | PRN
  Administered 2021-03-10: 100 ug via INTRAVENOUS
  Filled 2021-03-10: qty 2

## 2021-03-10 MED ORDER — SODIUM CHLORIDE 0.9 % IV SOLN: 1.0000 g | INTRAVENOUS | Status: DC

## 2021-03-10 MED ORDER — LACTATED RINGERS IV SOLN
500.0000 mL | Freq: Once | INTRAVENOUS | Status: AC
  Administered 2021-03-10: 500 mL via INTRAVENOUS

## 2021-03-10 MED ORDER — LACTATED RINGERS IV SOLN: INTRAVENOUS | Status: DC

## 2021-03-10 MED ORDER — LIDOCAINE HCL (PF) 1 % IJ SOLN: 30.0000 mL | INTRAMUSCULAR | Status: DC | PRN

## 2021-03-10 MED ORDER — OXYTOCIN BOLUS FROM INFUSION
333.0000 mL | Freq: Once | INTRAVENOUS | Status: AC
Start: 2021-03-10 — End: 2021-03-10
  Administered 2021-03-10: 333 mL via INTRAVENOUS

## 2021-03-10 MED ORDER — FENTANYL-BUPIVACAINE-NACL 0.5-0.125-0.9 MG/250ML-% EP SOLN
EPIDURAL | Status: DC | PRN
  Administered 2021-03-10: 12 mL/h via EPIDURAL

## 2021-03-10 MED ORDER — DIPHENHYDRAMINE HCL 25 MG PO CAPS: 25.0000 mg | ORAL_CAPSULE | Freq: Four times a day (QID) | ORAL | Status: DC | PRN

## 2021-03-10 MED ORDER — DOCUSATE SODIUM 100 MG PO CAPS
100.0000 mg | ORAL_CAPSULE | Freq: Two times a day (BID) | ORAL | Status: DC
Start: 2021-03-10 — End: 2021-03-13
  Administered 2021-03-10 – 2021-03-12 (×5): 100 mg via ORAL
  Filled 2021-03-10 (×5): qty 1

## 2021-03-10 MED ORDER — SOD CITRATE-CITRIC ACID 500-334 MG/5ML PO SOLN
30.0000 mL | ORAL | Status: DC | PRN
Start: 2021-03-10 — End: 2021-03-10

## 2021-03-10 MED ORDER — DIBUCAINE (PERIANAL) 1 % EX OINT
1.0000 "application " | TOPICAL_OINTMENT | CUTANEOUS | Status: DC | PRN
  Administered 2021-03-10: 1 via RECTAL
  Filled 2021-03-10 (×2): qty 28

## 2021-03-10 MED ORDER — OXYTOCIN 10 UNIT/ML IJ SOLN
INTRAMUSCULAR | Status: AC
  Filled 2021-03-10: qty 2

## 2021-03-10 MED ORDER — WITCH HAZEL-GLYCERIN EX PADS
1.0000 | MEDICATED_PAD | CUTANEOUS | Status: DC
Start: 2021-03-10 — End: 2021-03-13
  Administered 2021-03-10: 1 via TOPICAL
  Filled 2021-03-10 (×2): qty 100

## 2021-03-10 MED ORDER — OXYTOCIN-SODIUM CHLORIDE 30-0.9 UT/500ML-% IV SOLN
2.5000 [IU]/h | INTRAVENOUS | Status: DC
Start: 2021-03-10 — End: 2021-03-13
  Administered 2021-03-10: 2.5 [IU]/h via INTRAVENOUS
  Filled 2021-03-10: qty 1000

## 2021-03-10 MED ORDER — MISOPROSTOL 200 MCG PO TABS
ORAL_TABLET | ORAL | Status: AC
  Filled 2021-03-10: qty 4

## 2021-03-10 MED ORDER — EPHEDRINE 5 MG/ML INJ: 10.0000 mg | INTRAVENOUS | Status: DC | PRN

## 2021-03-10 MED ORDER — ONDANSETRON HCL 4 MG/2ML IJ SOLN: 4.0000 mg | Freq: Four times a day (QID) | INTRAMUSCULAR | Status: DC | PRN

## 2021-03-10 MED ORDER — PRENATAL MULTIVITAMIN CH
1.0000 | ORAL_TABLET | Freq: Every day | ORAL | Status: DC
  Administered 2021-03-11 – 2021-03-12 (×2): 1 via ORAL
  Filled 2021-03-10 (×2): qty 1

## 2021-03-10 MED ORDER — FENTANYL-BUPIVACAINE-NACL 0.5-0.125-0.9 MG/250ML-% EP SOLN: 12.0000 mL/h | EPIDURAL | Status: DC | PRN

## 2021-03-10 MED ORDER — ACETAMINOPHEN 325 MG PO TABS: 650.0000 mg | ORAL_TABLET | Freq: Four times a day (QID) | ORAL | Status: DC | PRN

## 2021-03-10 MED ORDER — LACTATED RINGERS IV SOLN: 500.0000 mL | INTRAVENOUS | Status: DC | PRN

## 2021-03-10 MED ORDER — LIDOCAINE HCL (PF) 1 % IJ SOLN
INTRAMUSCULAR | Status: AC
  Filled 2021-03-10: qty 30

## 2021-03-10 MED ORDER — SIMETHICONE 80 MG PO CHEW: 80.0000 mg | CHEWABLE_TABLET | ORAL | Status: DC | PRN

## 2021-03-10 MED ORDER — IBUPROFEN 600 MG PO TABS
600.0000 mg | ORAL_TABLET | Freq: Four times a day (QID) | ORAL | Status: DC
  Administered 2021-03-10 – 2021-03-12 (×9): 600 mg via ORAL
  Filled 2021-03-10 (×10): qty 1

## 2021-03-10 MED ORDER — PHENYLEPHRINE 40 MCG/ML (10ML) SYRINGE FOR IV PUSH (FOR BLOOD PRESSURE SUPPORT): 80.0000 ug | PREFILLED_SYRINGE | INTRAVENOUS | Status: DC | PRN

## 2021-03-10 MED ORDER — COCONUT OIL OIL
1.0000 "application " | TOPICAL_OIL | Status: DC | PRN
  Filled 2021-03-10: qty 120

## 2021-03-10 MED ORDER — DIPHENHYDRAMINE HCL 50 MG/ML IJ SOLN: 12.5000 mg | INTRAMUSCULAR | Status: DC | PRN

## 2021-03-10 MED ORDER — FENTANYL-BUPIVACAINE-NACL 0.5-0.125-0.9 MG/250ML-% EP SOLN
EPIDURAL | Status: AC
  Filled 2021-03-10: qty 250

## 2021-03-10 MED ORDER — CALCIUM CARBONATE ANTACID 500 MG PO CHEW: 2.0000 | CHEWABLE_TABLET | ORAL | Status: DC | PRN

## 2021-03-10 MED ORDER — ACETAMINOPHEN 500 MG PO TABS: 1000.0000 mg | ORAL_TABLET | Freq: Four times a day (QID) | ORAL | Status: DC | PRN

## 2021-03-10 MED ORDER — LIDOCAINE-EPINEPHRINE (PF) 1.5 %-1:200000 IJ SOLN
INTRAMUSCULAR | Status: DC | PRN
  Administered 2021-03-10: 3 mL via PERINEURAL

## 2021-03-10 MED ORDER — BUPIVACAINE HCL (PF) 0.25 % IJ SOLN
INTRAMUSCULAR | Status: DC | PRN
  Administered 2021-03-10: 4 mL via EPIDURAL
  Administered 2021-03-10: 2 mL via EPIDURAL

## 2021-03-10 MED ORDER — BENZOCAINE-MENTHOL 20-0.5 % EX AERO
1.0000 "application " | INHALATION_SPRAY | CUTANEOUS | Status: DC | PRN
  Administered 2021-03-10: 1 via TOPICAL
  Filled 2021-03-10 (×2): qty 56

## 2021-03-10 MED ORDER — ONDANSETRON HCL 4 MG PO TABS: 4.0000 mg | ORAL_TABLET | ORAL | Status: DC | PRN

## 2021-03-10 MED ORDER — ONDANSETRON HCL 4 MG/2ML IJ SOLN: 4.0000 mg | INTRAMUSCULAR | Status: DC | PRN

## 2021-03-10 MED ORDER — AMMONIA AROMATIC IN INHA
RESPIRATORY_TRACT | Status: AC
  Filled 2021-03-10: qty 10

## 2021-03-10 MED ORDER — ACETAMINOPHEN 500 MG PO TABS
1000.0000 mg | ORAL_TABLET | Freq: Four times a day (QID) | ORAL | Status: DC | PRN
  Administered 2021-03-11 – 2021-03-12 (×5): 1000 mg via ORAL
  Filled 2021-03-10 (×6): qty 2

## 2021-03-10 MED ORDER — ACETAMINOPHEN 325 MG PO TABS
650.0000 mg | ORAL_TABLET | ORAL | Status: DC | PRN
  Administered 2021-03-10: 650 mg via ORAL
  Filled 2021-03-10: qty 2

## 2021-03-10 NOTE — Discharge Instructions (Signed)
Vaginal Delivery, Care After Refer to this sheet in the next few weeks. These discharge instructions provide you with information on caring for yourself after delivery. Your caregiver may also give you specific instructions. Your treatment has been planned according to the most current medical practices available, but problems sometimes occur. Call your caregiver if you have any problems or questions after you go home. HOME CARE INSTRUCTIONS Take over-the-counter or prescription medicines only as directed by your caregiver or pharmacist. Do not drink alcohol, especially if you are breastfeeding or taking medicine to relieve pain. Do not smoke tobacco. Continue to use good perineal care. Good perineal care includes: Wiping your perineum from back to front Keeping your perineum clean. You can do sitz baths twice a day, to help keep this area clean Do not use tampons, douche or have sex until your caregiver says it is okay. Shower only and avoid sitting in submerged water, aside from sitz baths Wear a well-fitting bra that provides breast support. Eat healthy foods. Drink enough fluids to keep your urine clear or pale yellow. Eat high-fiber foods such as whole grain cereals and breads, brown rice, beans, and fresh fruits and vegetables every day. These foods may help prevent or relieve constipation. Avoid constipation with high fiber foods or medications, such as miralax or metamucil Follow your caregiver's recommendations regarding resumption of activities such as climbing stairs, driving, lifting, exercising, or traveling. Talk to your caregiver about resuming sexual activities. Resumption of sexual activities is dependent upon your risk of infection, your rate of healing, and your comfort and desire to resume sexual activity. Try to have someone help you with your household activities and your newborn for at least a few days after you leave the hospital. Rest as much as possible. Try to rest or  take a nap when your newborn is sleeping. Increase your activities gradually. Keep all of your scheduled postpartum appointments. It is very important to keep your scheduled follow-up appointments. At these appointments, your caregiver will be checking to make sure that you are healing physically and emotionally. SEEK MEDICAL CARE IF:  You are passing large clots from your vagina. Save any clots to show your caregiver. You have a foul smelling discharge from your vagina. You have trouble urinating. You are urinating frequently. You have pain when you urinate. You have a change in your bowel movements. You have increasing redness, pain, or swelling near your vaginal incision (episiotomy) or vaginal tear. You have pus draining from your episiotomy or vaginal tear. Your episiotomy or vaginal tear is separating. You have painful, hard, or reddened breasts. You have a severe headache. You have blurred vision or see spots. You feel sad or depressed. You have thoughts of hurting yourself or your newborn. You have questions about your care, the care of your newborn, or medicines. You are dizzy or light-headed. You have a rash. You have nausea or vomiting. You were breastfeeding and have not had a menstrual period within 12 weeks after you stopped breastfeeding. You are not breastfeeding and have not had a menstrual period by the 12th week after delivery. You have a fever. SEEK IMMEDIATE MEDICAL CARE IF:  You have persistent pain. You have chest pain. You have shortness of breath. You faint. You have leg pain. You have stomach pain. Your vaginal bleeding saturates two or more sanitary pads in 1 hour. MAKE SURE YOU:  Understand these instructions. Will watch your condition. Will get help right away if you are not doing well or   get worse. Document Released: 04/29/2000 Document Revised: 09/16/2013 Document Reviewed: 12/28/2011 ExitCare Patient Information 2015 [employer]. This  information is not intended to replace advice given to you by your health care provider. Make sure you discuss any questions you have with your health care provider.  Sitz Bath A sitz bath is a warm water bath taken in the sitting position. The water covers only the hips and butt (buttocks). We recommend using one that fits in the toilet, to help with ease of use and cleanliness. It may be used for either healing or cleaning purposes. Sitz baths are also used to relieve pain, itching, or muscle tightening (spasms). The water may contain medicine. Moist heat will help you heal and relax.  HOME CARE  Take 3 to 4 sitz baths a day. Fill the bathtub half-full with warm water. Sit in the water and open the drain a little. Turn on the warm water to keep the tub half-full. Keep the water running constantly. Soak in the water for 15 to 20 minutes. After the sitz bath, pat the affected area dry. GET HELP RIGHT AWAY IF: You get worse instead of better. Stop the sitz baths if you get worse. MAKE SURE YOU: Understand these instructions. Will watch your condition. Will get help right away if you are not doing well or get worse. Document Released: 06/09/2004 Document Revised: 01/25/2012 Document Reviewed: 08/30/2010 ExitCare Patient Information 2015 [employer]. This information is not intended to replace advice given to you by your health care provider. Make sure you discuss any questions you have with your health care provider.   

## 2021-03-10 NOTE — Anesthesia Procedure Notes (Signed)
Epidural Patient location during procedure: OB Start time: 03/10/2021 4:04 PM End time: 03/10/2021 4:11 PM  Staffing Anesthesiologist: Reed Breech, MD Resident/CRNA: Hezzie Bump, CRNA Performed: resident/CRNA   Preanesthetic Checklist Completed: patient identified, IV checked, site marked, risks and benefits discussed, surgical consent, monitors and equipment checked, pre-op evaluation and timeout performed  Epidural Patient position: sitting Prep: ChloraPrep Patient monitoring: heart rate, continuous pulse ox and blood pressure Approach: midline Location: L3-L4 Injection technique: LOR saline  Needle:  Needle type: Tuohy  Needle gauge: 17 G Needle length: 9 cm and 9 Needle insertion depth: 6.5 cm Catheter type: closed end flexible Catheter size: 19 Gauge Catheter at skin depth: 12 cm Test dose: negative and 1.5% lidocaine with Epi 1:200 K  Assessment Sensory level: T10 Events: blood not aspirated, injection not painful, no injection resistance, no paresthesia and negative IV test  Additional Notes 1 attempt Pt. Evaluated and documentation done after procedure finished. Patient identified. Risks/Benefits/Options discussed with patient including but not limited to bleeding, infection, nerve damage, paralysis, failed block, incomplete pain control, headache, blood pressure changes, nausea, vomiting, reactions to medication both or allergic, itching and postpartum back pain. Confirmed with bedside nurse the patient's most recent platelet count. Confirmed with patient that they are not currently taking any anticoagulation, have any bleeding history or any family history of bleeding disorders. Patient expressed understanding and wished to proceed. All questions were answered. Sterile technique was used throughout the entire procedure. Please see nursing notes for vital signs. Test dose was given through epidural catheter and negative prior to continuing to dose epidural or  start infusion. Warning signs of high block given to the patient including shortness of breath, tingling/numbness in hands, complete motor block, or any concerning symptoms with instructions to call for help. Patient was given instructions on fall risk and not to get out of bed. All questions and concerns addressed with instructions to call with any issues or inadequate analgesia.    Patient tolerated the insertion well without immediate complications.Reason for block:procedure for pain

## 2021-03-10 NOTE — OB Triage Note (Signed)
Pt discharged home in stable condition by provider. RN provided discharge instructions to pt, including information on when to come back. Pt verbalized understanding and all questions answered at this time.

## 2021-03-10 NOTE — OB Triage Note (Signed)
Nicole Mathews is a 20 y.o. female. She is at [redacted]w[redacted]d gestation. Patient's last menstrual period was 06/22/2020. Estimated Date of Delivery: 04/09/21  Prenatal care site: [facility]   Current pregnancy complicated by:  History of preterm delivery at 36 weeks Left sided adnexal tenderness at [facility]  Chief complaint: uterine contractions  She reports she had sex tonight around 2200 and has been having painful, frequent contractions since 2300. She is breathing through contractions and reports back pain with contractions. Denies leaking fluid, bloody show. Reports good fetal movement.  S: Resting comfortably. Frequent CTX, no VB.no LOF,  Active fetal movement.  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   Supervision of normal first teen pregnancy 08/09/2018     Clinic [facility] Prenatal Labs Dating  10 wk [place] Blood type: O/Positive/-- (03/26 1500)  Genetic Screen NIPS:   Normal XX Antibody:Negative (03/26 1500) Anatomic [place]  complete, low lying placenta Resolved on follow up Rubella: 4.25 (03/26 1500) Varicella: non-immune GTT Early:   Not needed     28 wk:  137    RPR: Non Reactive (03/26 1500)  Rhogam Not needed HBsAg: Negative (03/26 1500)  TDaP vaccin    Past Surgical History:  Procedure Laterality Date   ADENOIDECTOMY AND MYRINGOTOMY WITH TUBE PLACEMENT Bilateral    age 59    No Known Allergies  Prior to Admission medications   Medication Sig Start Date End Date Taking? Authorizing Provider  ferrous sulfate 324 MG TBEC Take 324 mg by mouth.   Yes [provider]   Prenatal Vit-Fe Fumarate-FA (PRENATAL MULTIVITAMIN) TABS tablet Take 1 tablet by mouth daily at 12 noon.   Yes [provider]  acetaminophen (TYLENOL) 325 MG tablet Take 2 tablets (650 mg total) by mouth every 4 (four) hours as needed (for pain scale < 4  OR  temperature  >/=  100.5 F). Patient not taking: Reported on 03/03/2021 01/21/21   McVey, Prudencio Pair, CNM  metroNIDAZOLE (FLAGYL) 500 MG tablet Take 1 tablet (500 mg total) by mouth every 12 (twelve) hours. Patient not taking: No sig reported 01/22/21   McVey, Prudencio Pair, CNM      Social History: She  reports that she has never smoked. She has never used smokeless tobacco. She reports that she does not drink alcohol and does not use drugs.  Family History: family history includes Diabetes in her maternal grandmother; Endometriosis in her maternal aunt; Lung cancer (age of onset: 49) in her maternal grandfather.  no history of gyn cancers  Review of Systems: A full review of systems was performed and negative except as noted in the HPI.     O:  BP 114/72 (BP Location: Left Arm)   Pulse (!) 107   Temp 98.1 F (36.7 C) (Oral)   Resp 14   LMP 06/22/2020  Results for orders placed or performed during the hospital encounter of 03/10/21 (from the past 48 hour(s))  Wet prep, genital   Collection Time: 03/10/21  3:58 AM  Result Value Ref Range   Yeast Wet Prep HPF POC  NONE SEEN NONE SEEN   Trich, Wet Prep NONE SEEN NONE SEEN   Clue Cells Wet Prep HPF POC NONE SEEN NONE SEEN   WBC, Wet Prep HPF POC FEW (A) NONE SEEN   Sperm NONE SEEN   Urinalysis, Routine w reflex microscopic   Collection Time: 03/10/21  3:58 AM  Result Value Ref Range   Color, Urine COLORLESS (A) YELLOW   APPearance CLEAR (A) CLEAR   Specific Gravity, Urine 1.003 (L) 1.005 - 1.030   pH 7.0 5.0 - 8.0   Glucose, UA NEGATIVE NEGATIVE mg/dL   Hgb urine dipstick NEGATIVE NEGATIVE   Bilirubin Urine NEGATIVE NEGATIVE   Ketones, ur NEGATIVE NEGATIVE mg/dL    Protein, ur NEGATIVE NEGATIVE mg/dL   Nitrite NEGATIVE NEGATIVE   Leukocytes,Ua NEGATIVE NEGATIVE      Constitutional: NAD, AAOx3  HE/ENT: extraocular movements grossly intact, moist mucous membranes CV: RRR PULM: nl respiratory effort, CTABL     Abd: gravid, non-tender, non-distended, soft      Ext: Non-tender, Nonedematous   Psych: mood appropriate, speech normal Pelvic: Cervical exam: 1.5/60%/-1  Pelvic exam: normal external genitalia, vulva, vagina, cervix, uterus and adnexa.  Fetal  monitoring: Cat 1  Baseline: 135bpm Variability: moderate Accelerations: present x >2 Decelerations absent Time 5hrs Contractions: q1-49min, lasting 40-60sec, palpate mild   A/P: 20 y.o. [redacted]w[redacted]d here for antenatal surveillance for uterine contractions  Principle Diagnosis:  Normal pregnancy in third trimester  Labor: not present. Uterine contractions present but spaced out and less intense after IVF and rest.  Fetal Wellbeing: Reassuring Cat 1 tracing. No vaginitis or UTI present. Advised no sex until 37wks to prevent preterm labor Encouraged resting and hydration Return for increasing contraction intervals, discomfort, or leaking fluid D/c home stable, precautions reviewed, follow-up as scheduled. Pt is agreeable with discharge and plan of care.   [name], CNM 03/10/2021 6:39 AM

## 2021-03-10 NOTE — Discharge Summary (Signed)
Obstetrical Discharge Summary  Patient Name: Nicole Mathews DOB: 02/13/2001 MRN: [id number]  Date of Admission: 03/10/2021 Date of Delivery: 03/10/2021 Delivered by: Nicole Mathews, CNM  Date of Discharge: 03/12/2021  [facility]: [facility] WVP:XTGGYIR'S last menstrual period was 06/22/2020. EDC Estimated Date of Delivery: 04/09/21 Gestational Age at Delivery: [redacted]w[redacted]d   Antepartum complications:  History of preterm birth at 21 weeks History of covid-19 infection 11/2020 Maternal iron deficiency anemia  Precipitous labor (current)  Admitting Diagnosis: Preterm labor [O60.00] Secondary Diagnosis: Patient Active Problem List   Diagnosis Date Noted   Preterm labor 03/10/2021   Supervision of normal pregnancy 09/24/2020    Augmentation: N/A Complications: None Intrapartum complications/course: Nicole Mathews, presented to L&D with worsening contractions.  She was monitored for a prolonged period earlier in the morning for irregular preterm contractions and discharged home after no cervical change.  Reports that contractions became more painful and consistent around 1300 and returned for evaluation.  She presented in active labor and progressed well to C/C/+1 with a bulging bag of water after receiving an epidural.  SROM for a moderate amount of clear fluid during pushing.  She pushed quickly over approximately 10 minutes for a spontaneous vaginal birth.  Delivery Type: spontaneous vaginal delivery Anesthesia: epidural Placenta: spontaneous Laceration: small vaginal abrasion, hemostatic - no repair  Episiotomy: none Newborn Data: Live born child "Nicole Mathews" Birth Weight: 4 lb 15.7 oz (2260 g) APGAR: 8, 8  Newborn Delivery   Birth date/time: 03/10/2021 16:51:00 Delivery type: Vaginal, Spontaneous     Postpartum Procedures:  Edinburgh:  Edinburgh Postnatal Depression Scale Screening Tool 03/11/2021 03/11/2021 03/10/2021 03/04/2019 01/22/2019  I have been able to laugh and see  the funny side of things. (No Data) 0 (No Data) 1 0  I have looked forward with enjoyment to things. - 0 - 1 0  I have blamed myself unnecessarily when things went wrong. - 0 - 3 1  I have been anxious or worried for no good reason. - 0 - 3 0  I have felt scared or panicky for no good reason. - 0 - 0 0  Things have been getting on top of me. - 0 - 2 1  I have been so unhappy that I have had difficulty sleeping. - 0 - 1 0  I have felt sad or miserable. - 0 - 2 0  I have been so unhappy that I have been crying. - 0 - 2 0  The thought of harming myself has occurred to me. - 0 - 0 0  Edinburgh Postnatal Depression Scale Total - 0 - 15 2     Post partum course:  Patient had an uncomplicated postpartum course.  By time of discharge on PPD#2, her pain was controlled on oral pain medications; she had appropriate lochia and was ambulating, voiding without difficulty and tolerating regular diet.  She was deemed stable for discharge to home.     Discharge Physical Exam:  BP 98/71 (BP Location: Right Arm)   Pulse 88   Temp 97.7 F (36.5 C) (Oral)   Resp 18   Ht 5\' 1"  (1.549 m)   Wt 54.4 kg   LMP 06/22/2020   SpO2 98%   Breastfeeding Unknown   BMI 22.67 kg/m   General: NAD CV: RRR Pulm: CTABL, nl effort ABD: s/nd/nt, fundus firm and below the umbilicus Lochia: moderate Perineum:minimal edema/ vaginal abrasion healing well DVT Evaluation: LE non-ttp, no evidence of DVT on exam.  Hemoglobin  Date Value Ref  Range Status  03/11/2021 10.7 (L) 12.0 - 15.0 g/dL Final  59/56/3875 64.3 (L) 11.1 - 15.9 g/dL Final   HCT  Date Value Ref Range Status  03/11/2021 29.9 (L) 36.0 - 46.0 % Final   Hematocrit  Date Value Ref Range Status  11/28/2018 31.0 (L) 34.0 - 46.6 % Final     Disposition: stable, discharge to home. Baby Feeding: breastmilk Baby Disposition: home with mom  Rh Immune globulin given: Rh pos Rubella vaccine given: Immune Varivax vaccine given: Immune Flu vaccine given  in AP or PP setting: declined  Tdap vaccine given in AP or PP setting: declined   Contraception: declines at this time, considering Nuvaring at a later date, patient understands breastfeeding mothers should use progestin only options. IUD, depo, Nexplanon, and POP's discussed  Prenatal Labs:  Blood type/Rh O pos  Antibody screen neg  Rubella Immune  Varicella Immune  RPR NR  HBsAg Neg  HIV NR  GC neg  Chlamydia neg  Genetic screening cfDNA negative, AFP neg  1 hour GTT 105  3 hour GTT N/A  GBS Negative     Plan:  Nicole Mathews was discharged to home in good condition. Follow-up appointment with delivering provider in 6 weeks.  Discharge Medications: Allergies as of 03/12/2021   Not on File      Medication List     STOP taking these medications    ferrous sulfate 324 MG Tbec Replaced by: ferrous sulfate 325 (65 FE) MG tablet       TAKE these medications    acetaminophen 500 MG tablet Commonly known as: TYLENOL Take 2 tablets (1,000 mg total) by mouth every 6 (six) hours as needed (for pain scale < 4).   benzocaine-Menthol 20-0.5 % Aero Commonly known as: DERMOPLAST Apply 1 application topically as needed for irritation (perineal discomfort).   coconut oil Oil Apply 1 application topically as needed.   dibucaine 1 % Oint Commonly known as: NUPERCAINAL Place 1 application rectally as needed for hemorrhoids.   docusate sodium 100 MG capsule Commonly known as: COLACE Take 1 capsule (100 mg total) by mouth 2 (two) times daily.   ferrous sulfate 325 (65 FE) MG tablet Take 1 tablet (325 mg total) by mouth 2 (two) times daily with a meal. Replaces: ferrous sulfate 324 MG Tbec   ibuprofen 600 MG tablet Commonly known as: ADVIL Take 1 tablet (600 mg total) by mouth every 6 (six) hours.   prenatal multivitamin Tabs tablet Take 1 tablet by mouth daily at 12 noon.   simethicone 80 MG chewable tablet Commonly known as: MYLICON Chew 1 tablet (80 mg  total) by mouth as needed for flatulence.   witch hazel-glycerin pad Commonly known as: TUCKS Apply 1 application topically continuous.         Follow-up Information     Nicole Mathews, CNM. Schedule an appointment as soon as possible for a visit in 6 week(s).   Specialty: Certified Nurse Midwife Why: postpartum visit Contact information: [address] [place] [address] [phone]                 Signed: Chari Manning CNM

## 2021-03-10 NOTE — OB Triage Note (Signed)
[facility] with complaints of contractions. Pt states ctx began around 2300 and are now about 3 mins a part. Pt states feeling back pain during contractions. Pt reports positive FM. Pt denies LOF and vaginal bleeding. Monitors applied and assessing. Initial FHT 155.

## 2021-03-10 NOTE — H&P (Signed)
OB History & Physical   History of Present Illness:   Chief Complaint: worsening contractions   HPI:  Nicole Mathews is a 20 y.o. G19P0101 female at [redacted]w[redacted]d dated by [place] at [redacted]w[redacted]d, not c/w LMP.  She presents to L&D for more painful contractions.  She presented to L&D earlier this morning for preterm contractions and was discharged after no cervical change.  Reports that contractions become worse around 1300 today and returned for evaluation.   Reports active fetal movement  Contractions: every 3 to 5 minutes LOF/SROM: denies  Vaginal bleeding: bloody show   Factors complicating pregnancy:  History of preterm birth at 31 weeks History of covid-19 infection 11/2020 Maternal iron deficiency anemia   Patient Active Problem List   Diagnosis Date Noted   Preterm labor 03/10/2021   Supervision of normal pregnancy 09/24/2020     Maternal Medical History:   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   Supervision of normal first teen pregnancy 08/09/2018     Clinic [facility] Prenatal Labs Dating  10 wk [place] Blood type: O/Positive/-- (03/26 1500)  Genetic Screen NIPS:   Normal XX Antibody:Negative (03/26 1500) Anatomic [place]  complete, low lying placenta Resolved on follow up Rubella: 4.25 (03/26 1500) Varicella: non-immune GTT Early:   Not needed     28 wk:  137    RPR: Non Reactive (03/26 1500)  Rhogam Not needed HBsAg: Negative (03/26 1500)  TDaP vaccin    Past Surgical History:  Procedure Laterality Date   ADENOIDECTOMY AND MYRINGOTOMY WITH TUBE PLACEMENT Bilateral    age 28    Not on File  Prior to Admission medications   Medication Sig Start Date End Date Taking? Authorizing  Provider  ferrous sulfate 324 MG TBEC Take 324 mg by mouth.   Yes [provider]  Prenatal Vit-Fe Fumarate-FA (PRENATAL MULTIVITAMIN) TABS tablet Take 1 tablet by mouth daily at 12 noon.   Yes [provider]  acetaminophen (TYLENOL) 325 MG tablet Take 2 tablets (650 mg total) by mouth every 4 (four) hours as needed (for pain scale < 4  OR  temperature  >/=  100.5 F). Patient not taking: Reported on 03/03/2021 01/21/21   McVey, Prudencio Pair, CNM  metroNIDAZOLE (FLAGYL) 500 MG tablet Take 1 tablet (500 mg total) by mouth every 12 (twelve) hours. Patient not taking: No sig reported 01/22/21   McVey, Prudencio Pair, CNM     Prenatal care site:  [facility]  Social History: She  reports that she has never smoked. She has never used smokeless tobacco. She reports that she does not drink alcohol and does not use drugs.  Family History: family history includes Diabetes in her maternal grandmother; Endometriosis in her maternal aunt; Lung cancer (age of onset: 64) in her maternal grandfather.   Review of Systems: A full review of systems was performed and negative except as noted in the HPI.     Physical Exam:  Vital Signs: BP 106/70 (BP Location: Left Arm)   Pulse (!) 102   Temp 98.3 F (36.8 C) (Oral)   Resp 16   Ht 5\' 1"  (1.549 m)   Wt  54.4 kg   LMP 06/22/2020   BMI 22.67 kg/m    General: no acute distress.  HEENT: normocephalic, atraumatic Heart: regular rate & rhythm.  No murmurs/rubs/gallops Lungs: clear to auscultation bilaterally, normal respiratory effort Abdomen: soft, gravid, non-tender;  EFW: 5lbs  Pelvic:   External: Normal external female genitalia  Cervix: Dilation: 6.5 / Effacement (%): 60 / Station: -1    Extremities: non-tender, symmetric, No edema bilaterally.  DTRs: 2+/2+  Neurologic: Alert & oriented x 3.    Results for orders placed or performed during the hospital encounter of 03/10/21 (from the past 24 hour(s))  Resp Panel by RT-PCR (Flu  A&B, Covid) Nasopharyngeal Swab     Status: None   Collection Time: 03/10/21  2:33 PM   Specimen: Nasopharyngeal Swab; Nasopharyngeal(NP) swabs in vial transport medium  Result Value Ref Range   SARS Coronavirus 2 by RT PCR NEGATIVE NEGATIVE   Influenza A by PCR NEGATIVE NEGATIVE   Influenza B by PCR NEGATIVE NEGATIVE  Group B strep by PCR     Status: None   Collection Time: 03/10/21  2:33 PM   Specimen: Vaginal/Rectal; Genital  Result Value Ref Range   Group B strep by PCR NEGATIVE NEGATIVE  CBC     Status: Abnormal   Collection Time: 03/10/21  2:45 PM  Result Value Ref Range   WBC 11.4 (H) 4.0 - 10.5 K/uL   RBC 3.95 3.87 - 5.11 MIL/uL   Hemoglobin 12.9 12.0 - 15.0 g/dL   HCT 16.1 09.6 - 04.5 %   MCV 93.2 80.0 - 100.0 fL   MCH 32.7 26.0 - 34.0 pg   MCHC 35.1 30.0 - 36.0 g/dL   RDW 40.9 81.1 - 91.4 %   Platelets 170 150 - 400 K/uL   nRBC 0.0 0.0 - 0.2 %  Type and screen     Status: None (Preliminary result)   Collection Time: 03/10/21  3:11 PM  Result Value Ref Range   ABO/RH(D) O POS    Antibody Screen POS    Sample Expiration      03/13/2021,2359 Performed at [facility], [address] [place], [place] [address]    Antibody Identification PENDING     Pertinent Results:  Prenatal Labs: Blood type/Rh O pos  Antibody screen neg  Rubella Immune  Varicella Immune  RPR NR  HBsAg Neg  HIV NR  GC neg  Chlamydia neg  Genetic screening cfDNA negative, AFP neg  1 hour GTT 105  3 hour GTT N/A  GBS Negative    FHT:  FHR: 145 bpm, variability: moderate,  accelerations:  Present,  decelerations:  Absent Category/reactivity:  Category I UC:   regular, every 2-4 minutes   Cephalic by Le[place] and SVE   No results found.  Assessment:  Nicole Mathews is a 20 y.o. G30P0101 female at [redacted]w[redacted]d with Preterm labor.   Plan:  1. Admit to Labor & Delivery; consents reviewed and obtained - Covid admission screen   2. Fetal Well being  - Fetal Tracing: cat 1 -  Group B Streptococcus ppx not indicated: GBS neg by PCR today - one dose of Ampicillin 2 grams given prior to known GBS status  - Presentation: cephalic confirmed by SVE   3. Routine OB: - Prenatal labs reviewed, as above - Rh pos - CBC, T&S, RPR on admit - Clear fluids, IVF  4. Monitoring of labor  - Contractions monitored with external toco - Pelvis proven to 5lbs 12oz, adequate for  trial of labor  - Plan for expectant management  - Augmentation with AROM as appropriate  - Plan for  continuous fetal monitoring - Maternal pain control as desired; planning regional anesthesia - Anticipate vaginal delivery  5. Post Partum Planning: - Infant feeding: Breast  - Contraception: TBD - Tdap vaccine: declined  - Flu vaccine: declined   Gustavo Lah, CNM 03/10/21 4:31 PM  Margaretmary Eddy, CNM Certified Nurse Midwife [facility]

## 2021-03-10 NOTE — Anesthesia Preprocedure Evaluation (Addendum)
Anesthesia Evaluation  Patient identified by MRN, date of birth, ID band Patient awake    Reviewed: Allergy & Precautions, H&P , NPO status , Patient's Chart, lab work & pertinent test results  Airway Mallampati: II  TM Distance: >3 FB Neck ROM: full    Dental no notable dental hx.    Pulmonary    Pulmonary exam normal        Cardiovascular negative cardio ROS Normal cardiovascular exam     Neuro/Psych Anxiety negative neurological ROS     GI/Hepatic Neg liver ROS, GERD  ,  Endo/Other  negative endocrine ROS  Renal/GU negative Renal ROS  negative genitourinary   Musculoskeletal   Abdominal   Peds  Hematology  (+) Blood dyscrasia, anemia ,   Anesthesia Other Findings 20 yo G2P0101 at 59 5/7 for labor epidural.  Reproductive/Obstetrics (+) Pregnancy                            Anesthesia Physical Anesthesia Plan  ASA: 2  Anesthesia Plan: Epidural   Post-op Pain Management:    Induction:   PONV Risk Score and Plan: 2 and Treatment may vary due to age or medical condition  Airway Management Planned:   Additional Equipment:   Intra-op Plan:   Post-operative Plan:   Informed Consent: I have reviewed the patients History and Physical, chart, labs and discussed the procedure including the risks, benefits and alternatives for the proposed anesthesia with the patient or authorized representative who has indicated his/her understanding and acceptance.       Plan Discussed with: CRNA and Anesthesiologist  Anesthesia Plan Comments:         Anesthesia Quick Evaluation

## 2021-03-10 NOTE — Discharge Instructions (Signed)

## 2021-03-11 LAB — CBC
HCT: 29.9 % — ABNORMAL LOW (ref 36.0–46.0)
Hemoglobin: 10.7 g/dL — ABNORMAL LOW (ref 12.0–15.0)
MCH: 33.1 pg (ref 26.0–34.0)
MCHC: 35.8 g/dL (ref 30.0–36.0)
MCV: 92.6 fL (ref 80.0–100.0)
Platelets: 144 10*3/uL — ABNORMAL LOW (ref 150–400)
RBC: 3.23 MIL/uL — ABNORMAL LOW (ref 3.87–5.11)
RDW: 13 % (ref 11.5–15.5)
WBC: 10.3 10*3/uL (ref 4.0–10.5)
nRBC: 0 % (ref 0.0–0.2)

## 2021-03-11 LAB — RPR: RPR Ser Ql: NONREACTIVE

## 2021-03-11 MED ORDER — FERROUS SULFATE 325 (65 FE) MG PO TABS
325.0000 mg | ORAL_TABLET | Freq: Two times a day (BID) | ORAL | Status: DC
  Administered 2021-03-11 – 2021-03-12 (×3): 325 mg via ORAL
  Filled 2021-03-11 (×4): qty 1

## 2021-03-11 MED ORDER — OXYCODONE HCL 5 MG PO TABS
5.0000 mg | ORAL_TABLET | Freq: Four times a day (QID) | ORAL | Status: DC | PRN
  Administered 2021-03-11 – 2021-03-12 (×5): 5 mg via ORAL
  Filled 2021-03-11 (×5): qty 1

## 2021-03-11 NOTE — Progress Notes (Signed)
Post Partum Day 1  Subjective: Doing well, no concerns. Ambulating without difficulty, pain managed with PO meds, tolerating regular diet, and voiding without difficulty.   No fever/chills, chest pain, shortness of breath, nausea/vomiting, or leg pain. No nipple or breast pain. No headache, visual changes, or RUQ/epigastric pain.  Objective: BP 94/61 (BP Location: Left Arm)   Pulse 97   Temp 98 F (36.7 C) (Oral)   Resp 18   Ht 5\' 1"  (1.549 m)   Wt 54.4 kg   LMP 06/22/2020   SpO2 98%   Breastfeeding Unknown   BMI 22.67 kg/m    Physical Exam:  General: alert and cooperative Breasts: soft/nontender CV: RRR Pulm: nl effort Abdomen: soft, non-tender Uterine Fundus: firm Incision: n/a Perineum: minimal edema, intact Lochia: appropriate DVT Evaluation: No evidence of DVT seen on physical exam. Edinburgh:  Edinburgh Postnatal Depression Scale Screening Tool 03/10/2021 03/04/2019 01/22/2019 01/21/2019  I have been able to laugh and see the funny side of things. (No Data) 1 0 (No Data)  I have looked forward with enjoyment to things. - 1 0 -  I have blamed myself unnecessarily when things went wrong. - 3 1 -  I have been anxious or worried for no good reason. - 3 0 -  I have felt scared or panicky for no good reason. - 0 0 -  Things have been getting on top of me. - 2 1 -  I have been so unhappy that I have had difficulty sleeping. - 1 0 -  I have felt sad or miserable. - 2 0 -  I have been so unhappy that I have been crying. - 2 0 -  The thought of harming myself has occurred to me. - 0 0 -  Edinburgh Postnatal Depression Scale Total - 15 2 -     Recent Labs    03/10/21 1445 03/11/21 0514  HGB 12.9 10.7*  HCT 36.8 29.9*  WBC 11.4* 10.3  PLT 170 144*    Assessment/Plan: 20 y.o. 03/13/21 postpartum day # 1  -Continue routine postpartum care -Lactation consult PRN for breastfeeding   -Acute blood loss anemia - hemodynamically stable and asymptomatic; start PO ferrous  sulfate BID with stool softeners  -Immunization status: Needs Tdap, and flu prior to discharge  Disposition: Continue inpatient postpartum care    LOS: 1 day   Esteen Delpriore, CNM 03/11/2021, 10:24 AM

## 2021-03-11 NOTE — Lactation Note (Signed)
This note was copied from a baby's chart. Lactation Consultation Note  Patient Name: Nicole Mathews [id number]'N Date: 03/11/2021   Age:19 hours  Mom is P2, SVD to second late preterm baby. First baby born at 33wks, over 57yrs ago, breastfed for a short period due to difficult latch, however mom continued to pump and provide EBM for 8 months. Baby has gone to the breast since delivery and has been given expressed colostrum of 2-19mL several times via cup/spoon.  Lactation team spoke with mom this morning re: late preterm feeding protocol, importance of frequent feedings, limiting time of feedings, hand expression/use of DEBP and supplement post feeding. Mom says that baby has been sleepy this morning with short feedings and she recently just gave expressed colostrum since baby did not latch. Lactation worked with mom on set-up of DEBP, education, use, cleaning, and milk storage all reviewed by [employer].  Lactation and cousin (who is Charity fundraiser) were present and assisted with feeding at the breast with use of nipple shield (size 67mm) due to flat/short nipples. Baby latched easily, had strong rhythmic sucking pattern, audible swallows with colostrum/transitional milk visible in the shield post feedings.  Reviewed behaviors of late preterm baby's, tendency to be sleepy and tire out at the breast before completing a feed. Explained importance of frequent feedings and pumping + supplementation. Parents verbalize understanding.  Maternal Data    Feeding    LATCH Score                    Lactation Tools Discussed/Used    Interventions    Discharge    Consult Status      Danford Bad 03/11/2021, 3:41 PM

## 2021-03-11 NOTE — Anesthesia Postprocedure Evaluation (Signed)
Anesthesia Post Note  Patient: Nicole Mathews  Procedure(s) Performed: AN AD HOC LABOR EPIDURAL  Patient location during evaluation: Mother Baby Anesthesia Type: Epidural Level of consciousness: awake and alert Pain management: pain level controlled Vital Signs Assessment: post-procedure vital signs reviewed and stable Respiratory status: spontaneous breathing, nonlabored ventilation and respiratory function stable Cardiovascular status: stable Postop Assessment: no headache, no backache and epidural receding Anesthetic complications: no   No notable events documented.   Last Vitals:  Vitals:   03/11/21 0359 03/11/21 0844  BP: 102/76 94/61  Pulse: 91 97  Resp: 17 18  Temp: (!) 36.4 C 36.7 C  SpO2: 98%     Last Pain:  Vitals:   03/11/21 0844  TempSrc: Oral  PainSc:                  [name]

## 2021-03-12 MED ORDER — FERROUS SULFATE 325 (65 FE) MG PO TABS: 325.0000 mg | ORAL_TABLET | Freq: Two times a day (BID) | ORAL | 3 refills | Status: AC

## 2021-03-12 MED ORDER — ACETAMINOPHEN 500 MG PO TABS: 1000.0000 mg | ORAL_TABLET | Freq: Four times a day (QID) | ORAL | 0 refills | Status: DC | PRN

## 2021-03-12 MED ORDER — WITCH HAZEL-GLYCERIN EX PADS: 1.0000 "application " | MEDICATED_PAD | CUTANEOUS | 12 refills | Status: DC

## 2021-03-12 MED ORDER — BENZOCAINE-MENTHOL 20-0.5 % EX AERO: 1.0000 "application " | INHALATION_SPRAY | CUTANEOUS | Status: DC | PRN

## 2021-03-12 MED ORDER — COCONUT OIL OIL: 1.0000 "application " | TOPICAL_OIL | 0 refills | Status: AC | PRN

## 2021-03-12 MED ORDER — DIBUCAINE (PERIANAL) 1 % EX OINT: 1.0000 "application " | TOPICAL_OINTMENT | CUTANEOUS | Status: DC | PRN

## 2021-03-12 MED ORDER — SIMETHICONE 80 MG PO CHEW: 80.0000 mg | CHEWABLE_TABLET | ORAL | 0 refills | Status: DC | PRN

## 2021-03-12 MED ORDER — DOCUSATE SODIUM 100 MG PO CAPS: 100.0000 mg | ORAL_CAPSULE | Freq: Two times a day (BID) | ORAL | 0 refills | Status: DC

## 2021-03-12 MED ORDER — IBUPROFEN 600 MG PO TABS: 600.0000 mg | ORAL_TABLET | Freq: Four times a day (QID) | ORAL | 0 refills | Status: DC

## 2021-03-12 NOTE — Lactation Note (Signed)
This note was copied from a baby's chart. Lactation Consultation Note  Patient Name: Nicole Mathews [id number]'K Date: 03/12/2021 Reason for consult: Follow-up assessment;Mother's request Age:19 hours  Maternal Data    Feeding Mother's Current Feeding Choice: Breast Milk Nipple Type: Slow - flow Baby very sleepy and mom can't get baby to stay latched because of sleepiness, placed nipple shield, baby latched with gentle chin pressure, would stay on breast then, nursed approx 5 min, came off burped and would not relatch to breast, mom started pumping breasts and I and Maddie RN started bottle feed of EBM colostrum with extra slow flow nipple, baby very sleepy for [place] also, requires frequent burping.    LATCH Score Latch: Repeated attempts needed to sustain latch, nipple held in mouth throughout feeding, stimulation needed to elicit sucking reflex. (sleepy)  Audible Swallowing: Spontaneous and intermittent  Type of Nipple: Everted at rest and after stimulation  Comfort (Breast/Nipple): Soft / non-tender  Hold (Positioning): Assistance needed to correctly position infant at breast and maintain latch.  LATCH Score: 8   Lactation Tools Discussed/Used Tools: Nipple Shields Nipple shield size: 16  Interventions Interventions: Assisted with latch  Discharge    Consult Status Consult Status: PRN Date: 03/12/21 Follow-up type: In-patient    Dyann Kief 03/12/2021, 6:06 PM

## 2021-03-12 NOTE — Progress Notes (Signed)
Discharge instructions given to patient. Patient to room in with infant.

## 2021-03-12 NOTE — Lactation Note (Signed)
This note was copied from a baby's chart. Lactation Consultation Note  Patient Name: Nicole Mathews [id number] Date: 03/12/2021 Reason for consult: Follow-up assessment;Late-preterm 34-36.6wks;Infant < 6lbs Age:19 hours  Maternal Data Has patient been taught Hand Expression?: Yes Does the patient have breastfeeding experience prior to this delivery?: Yes How long did the patient breastfeed?: 8 mths (mostly pumped, used a nipple shield, latch issues)  Feeding Mother's Current Feeding Choice: Breast Milk Mom had prepumped 14 cc EBM, now baby fussy and rooting, baby latched easily without nipple shield left cradle hold, mom shown how to stimulate baby to stay awake at breast, lots of swallows heard when baby active at nursing, just falls asleep easily, on right breast mom turned to right side lying position and baby placed beside her to maintain baby' s alertness, nursed well with less stimulation x 10 min, lots of swallows and gulping noted., baby burped during feeding, I fed baby 12cc EBM after with bottle and slow flow nipple, with paced feed, baby took well, but could not take all of bottle, began to spit last out.  Baby burped and is alert lying on back in crib.      LATCH Score Latch: Grasps breast easily, tongue down, lips flanged, rhythmical sucking.  Audible Swallowing: Spontaneous and intermittent  Type of Nipple: Everted at rest and after stimulation  Comfort (Breast/Nipple): Soft / non-tender  Hold (Positioning): Assistance needed to correctly position infant at breast and maintain latch.  LATCH Score: 9   Lactation Tools Discussed/Used Tools: Bottle Breast pump type: Double-Electric Breast Pump Reason for Pumping: to provide supplement Pumping frequency: after feedings Pumped volume: 14 mL  Interventions Interventions: Breast feeding basics reviewed;Assisted with latch;Skin to skin;Hand express;Adjust position;Support pillows;DEBP;Education;Pace feeding LC name  updated on white board Discharge Pump: Personal;DEBP WIC Program: No  Consult Status Consult Status: PRN Date: 03/12/21 Follow-up type: In-patient    Dyann Kief 03/12/2021, 2:30 PM

## 2021-03-13 LAB — TYPE AND SCREEN
ABO/RH(D): O POS
Antibody Screen: POSITIVE
Unit division: 0
Unit division: 0

## 2021-03-13 LAB — BPAM RBC
Blood Product Expiration Date: 202211232359
Blood Product Expiration Date: 202211262359
Unit Type and Rh: 5100
Unit Type and Rh: 5100

## 2021-03-15 ENCOUNTER — Telehealth: Payer: Self-pay

## 2021-03-15 NOTE — Telephone Encounter (Signed)
Transition Care Management Follow-up Telephone Call Date of discharge and from where: 03/12/2021-[facility] How have you been since you were released from the hospital? Patient stated she is doing fine.  Any questions or concerns? No  Items Reviewed: Did the pt receive and understand the discharge instructions provided? Yes  Medications obtained and verified? Yes  Other? No  Any new allergies since your discharge? No  Dietary orders reviewed? No Do you have support at home? Yes   Home Care and Equipment/Supplies: Were home health services ordered? not applicable If so, what is the name of the agency? N/A  Has the agency set up a time to come to the patient's home? not applicable Were any new equipment or medical supplies ordered?  No What is the name of the medical supply agency? N/A Were you able to get the supplies/equipment? not applicable Do you have any questions related to the use of the equipment or supplies? No  Functional Questionnaire: (I = Independent and D = Dependent) ADLs: I  Bathing/Dressing- I  Meal Prep- I  Eating- I  Maintaining continence- I  Transferring/Ambulation- I  Managing Meds- I  Follow up appointments reviewed:  PCP Hospital f/u appt confirmed? No   Specialist Hospital f/u appt confirmed? Yes  Scheduled to see OBGYN on 03/15/2021 @ 1:30PM. Are transportation arrangements needed? No  If their condition worsens, is the pt aware to call PCP or go to the Emergency Dept.? Yes Was the patient provided with contact information for the PCP's office or ED? Yes Was to pt encouraged to call back with questions or concerns? Yes

## 2021-03-20 NOTE — Discharge Summary (Signed)
Nicole Mathews is a 20 y.o. female. She is at [redacted]w[redacted]d gestation. Patient's last menstrual period was 06/22/2020. Estimated Date of Delivery: 04/09/21   Prenatal care site: [facility]    Current pregnancy complicated by:  History of preterm delivery at 36 weeks Left sided adnexal tenderness at [facility]   Chief complaint: uterine contractions   She reports she had sex tonight around 2200 and has been having painful, frequent contractions since 2300. She is breathing through contractions and reports back pain with contractions. Denies leaking fluid, bloody show. Reports good fetal movement.   S: Resting comfortably. Frequent CTX, no VB.no LOF,  Active fetal movement.  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   Supervision of normal first teen pregnancy 08/09/2018      Clinic    [facility]             Prenatal Labs Dating       10 wk [place]          Blood type: O/Positive/-- (03/26 1500)  Genetic Screen             NIPS:   Normal XX      Antibody:Negative (03/26 1500) Anatomic [place]          complete, low lying placenta Resolved on follow up         Rubella: 4.25 (03/26 1500) Varicella: non-immune GTT    Early:   Not needed     28 wk:  137       RPR: Non Reactive (03/26 1500)  Rhogam      Not needed             HBsAg: Negative (03/26 1500)  TDaP vaccin           Past Surgical History:  Procedure Laterality Date   ADENOIDECTOMY AND MYRINGOTOMY WITH TUBE PLACEMENT Bilateral      age 20      No Known Allergies          Prior to Admission medications   Medication Sig Start Date End  Date Taking? Authorizing Provider  ferrous sulfate 324 MG TBEC Take 324 mg by mouth.     Yes [provider]  Prenatal Vit-Fe Fumarate-FA (PRENATAL MULTIVITAMIN) TABS tablet Take 1 tablet by mouth daily at 12 noon.     Yes [provider]  acetaminophen (TYLENOL) 325 MG tablet Take 2 tablets (650 mg total) by mouth every 4 (four) hours as needed (for pain scale < 4  OR  temperature  >/=  100.5 F). Patient not taking: Reported on 03/03/2021 01/21/21     McVey, Prudencio Pair, CNM  metroNIDAZOLE (FLAGYL) 500 MG tablet Take 1 tablet (500 mg total) by mouth every 12 (twelve) hours. Patient not taking: No sig reported 01/22/21     McVey, Prudencio Pair, CNM          Social History: She  reports that she has never smoked. She has never used smokeless tobacco. She reports that she does not drink  alcohol and does not use drugs.   Family History: family history includes Diabetes in her maternal grandmother; Endometriosis in her maternal aunt; Lung cancer (age of onset: 33) in her maternal grandfather.  no history of gyn cancers   Review of Systems: A full review of systems was performed and negative except as noted in the HPI.       O:  BP 114/72 (BP Location: Left Arm)   Pulse (!) 107   Temp 98.1 F (36.7 C) (Oral)   Resp 14   LMP 06/22/2020       Results for orders placed or performed during the hospital encounter of 03/10/21 (from the past 48 hour(s))  Wet prep, genital    Collection Time: 03/10/21  3:58 AM  Result Value Ref Range    Yeast Wet Prep HPF POC NONE SEEN NONE SEEN    Trich, Wet Prep NONE SEEN NONE SEEN    Clue Cells Wet Prep HPF POC NONE SEEN NONE SEEN    WBC, Wet Prep HPF POC FEW (A) NONE SEEN    Sperm NONE SEEN    Urinalysis, Routine w reflex microscopic    Collection Time: 03/10/21  3:58 AM  Result Value Ref Range    Color, Urine COLORLESS (A) YELLOW    APPearance CLEAR (A) CLEAR    Specific Gravity, Urine 1.003 (L) 1.005 - 1.030    pH 7.0 5.0 - 8.0    Glucose,  UA NEGATIVE NEGATIVE mg/dL    Hgb urine dipstick NEGATIVE NEGATIVE    Bilirubin Urine NEGATIVE NEGATIVE    Ketones, ur NEGATIVE NEGATIVE mg/dL    Protein, ur NEGATIVE NEGATIVE mg/dL    Nitrite NEGATIVE NEGATIVE    Leukocytes,Ua NEGATIVE NEGATIVE      Constitutional: NAD, AAOx3  HE/ENT: extraocular movements grossly intact, moist mucous membranes CV: RRR PULM: nl respiratory effort, CTABL                                         Abd: gravid, non-tender, non-distended, soft                                                  Ext: Non-tender, Nonedematous                     Psych: mood appropriate, speech normal Pelvic: Cervical exam: 1.5/60%/-1             Pelvic exam: normal external genitalia, vulva, vagina, cervix, uterus and adnexa.   Fetal  monitoring: Cat 1  Baseline: 135bpm Variability: moderate Accelerations: present x >2 Decelerations absent Time 5hrs Contractions: q1-47min, lasting 40-60sec, palpate mild     A/P: 20 y.o. [redacted]w[redacted]d here for antenatal surveillance for uterine contractions   Principle Diagnosis:  Normal pregnancy in third trimester   Labor: not present. Uterine contractions present but spaced out and less intense after IVF and rest.  Fetal Wellbeing: Reassuring Cat 1 tracing. No vaginitis or UTI present. Advised no sex until 37wks to prevent preterm labor Encouraged resting and hydration Return for increasing contraction intervals, discomfort, or leaking fluid D/c home stable, precautions reviewed, follow-up as scheduled. Pt is agreeable with discharge and plan of care.     [name], CNM 03/10/2021 7:05 AM

## 2021-05-24 DIAGNOSIS — R102 Pelvic and perineal pain: Secondary | ICD-10-CM | POA: Diagnosis not present

## 2021-05-24 DIAGNOSIS — R1032 Left lower quadrant pain: Secondary | ICD-10-CM | POA: Diagnosis not present

## 2021-05-27 ENCOUNTER — Ambulatory Visit: Payer: Self-pay

## 2021-05-27 NOTE — Lactation Note (Signed)
This note was copied from a baby's chart. Lactation Consultation Note  Patient Name: Belva Agee [id number] Date: 05/27/2021   Age:20 m.o.   LC Note:  21 mm flanges sent to the pediatric unit per staff request.   Maternal Data    Feeding    LATCH Score                    Lactation Tools Discussed/Used    Interventions    Discharge    Consult Status      Daunte Oestreich R Jatavis Malek 05/27/2021, 4:50 PM

## 2021-05-28 ENCOUNTER — Ambulatory Visit: Payer: Self-pay

## 2021-05-28 NOTE — Lactation Note (Signed)
This note was copied from a baby's chart. Lactation Consultation Note  Patient Name: Belva Agee [id number] Date: 05/28/2021 Reason for consult: Follow-up assessment;1st time breastfeeding;Early term 37-38.6wks Age:20 m.o., female infant with RSV and COVID. Per mom, she is "pumping only"  her choice , mom is pumping 4 ounces per pumping session and using 21 breast flange. Infant is consuming 3 ounces of EBM every 2 hours. Mom doesn't have any questions or concerns for LC at this time.  Maternal Data    Feeding Mother's Current Feeding Choice: Breast Milk  LATCH Score                    Lactation Tools Discussed/Used Tools: Pump Breast pump type: Double-Electric Breast Pump Pump Education: Setup, frequency, and cleaning;Milk Storage Reason for Pumping: Mom's feeding choice is pumping only. Pumping frequency: Mom is pumping every 3 hours. Pumped volume: 120 mL  Interventions    Discharge    Consult Status Consult Status: Follow-up Date: 05/28/21 Follow-up type: In-patient    Danelle Earthly 05/28/2021, 4:04 AM

## 2021-07-28 ENCOUNTER — Other Ambulatory Visit: Payer: Self-pay | Admitting: Gastroenterology

## 2021-07-28 DIAGNOSIS — R1032 Left lower quadrant pain: Secondary | ICD-10-CM

## 2021-08-09 ENCOUNTER — Other Ambulatory Visit: Payer: Self-pay

## 2021-08-09 ENCOUNTER — Ambulatory Visit
Admission: RE | Admit: 2021-08-09 | Discharge: 2021-08-09 | Disposition: A | Discharge status: Home / Self Care | Payer: BC Managed Care – PPO | Source: Ambulatory Visit | Attending: Gastroenterology | Admitting: Gastroenterology

## 2021-08-09 DIAGNOSIS — R1032 Left lower quadrant pain: Secondary | ICD-10-CM | POA: Diagnosis not present

## 2021-08-09 MED ORDER — IOHEXOL 300 MG/ML  SOLN
80.0000 mL | Freq: Once | INTRAMUSCULAR | Status: AC | PRN
  Administered 2021-08-09: 80 mL via INTRAVENOUS

## 2021-09-29 DIAGNOSIS — R300 Dysuria: Secondary | ICD-10-CM | POA: Diagnosis not present

## 2021-12-16 DIAGNOSIS — K581 Irritable bowel syndrome with constipation: Secondary | ICD-10-CM | POA: Diagnosis not present

## 2021-12-16 DIAGNOSIS — R110 Nausea: Secondary | ICD-10-CM | POA: Diagnosis not present

## 2021-12-16 DIAGNOSIS — F32A Depression, unspecified: Secondary | ICD-10-CM | POA: Diagnosis not present

## 2021-12-16 DIAGNOSIS — F411 Generalized anxiety disorder: Secondary | ICD-10-CM | POA: Diagnosis not present

## 2022-01-02 DIAGNOSIS — J029 Acute pharyngitis, unspecified: Secondary | ICD-10-CM | POA: Diagnosis not present

## 2022-01-16 DIAGNOSIS — R1032 Left lower quadrant pain: Secondary | ICD-10-CM | POA: Diagnosis not present

## 2022-01-19 DIAGNOSIS — N898 Other specified noninflammatory disorders of vagina: Secondary | ICD-10-CM | POA: Diagnosis not present

## 2022-01-19 DIAGNOSIS — R300 Dysuria: Secondary | ICD-10-CM | POA: Diagnosis not present

## 2022-01-19 DIAGNOSIS — R1031 Right lower quadrant pain: Secondary | ICD-10-CM | POA: Diagnosis not present

## 2022-01-19 DIAGNOSIS — R1032 Left lower quadrant pain: Secondary | ICD-10-CM | POA: Diagnosis not present

## 2022-01-19 DIAGNOSIS — K5909 Other constipation: Secondary | ICD-10-CM | POA: Diagnosis not present

## 2022-01-20 ENCOUNTER — Telehealth: Payer: Self-pay | Admitting: Gastroenterology

## 2022-01-20 NOTE — Telephone Encounter (Signed)
Good Afternoon Dr. Lavon Paganini,  I have a patient requesting a transfer of care to you specifically. She has been seen at [facility] for her issues but states they have not been helpful and dropped the ball on her care.  Records of her office visits are in Epic, care everywhere for you to review.  Please advise on scheduling. Thank you!

## 2022-01-25 NOTE — Telephone Encounter (Signed)
Request received to transfer GI care from outside practice to [facility].  We appreciate the interest in our practice, however at this time due to high demand from patients without established GI providers we cannot accommodate this transfer.  Ability to accommodate future transfer requests may change over time and the patient can contact us again in 6-12 months if still interested in being seen at [facility].      °

## 2022-01-26 DIAGNOSIS — K581 Irritable bowel syndrome with constipation: Secondary | ICD-10-CM | POA: Diagnosis not present

## 2022-01-26 DIAGNOSIS — R5383 Other fatigue: Secondary | ICD-10-CM | POA: Diagnosis not present

## 2022-01-26 DIAGNOSIS — F419 Anxiety disorder, unspecified: Secondary | ICD-10-CM | POA: Diagnosis not present

## 2022-01-26 DIAGNOSIS — Z0000 Encounter for general adult medical examination without abnormal findings: Secondary | ICD-10-CM | POA: Diagnosis not present

## 2022-01-26 DIAGNOSIS — Z1331 Encounter for screening for depression: Secondary | ICD-10-CM | POA: Diagnosis not present

## 2022-02-25 DIAGNOSIS — Z3687 Encounter for antenatal screening for uncertain dates: Secondary | ICD-10-CM | POA: Diagnosis not present

## 2022-02-28 DIAGNOSIS — Z3687 Encounter for antenatal screening for uncertain dates: Secondary | ICD-10-CM | POA: Diagnosis not present

## 2022-03-29 DIAGNOSIS — O09219 Supervision of pregnancy with history of pre-term labor, unspecified trimester: Secondary | ICD-10-CM | POA: Diagnosis not present

## 2022-03-29 DIAGNOSIS — O09899 Supervision of other high risk pregnancies, unspecified trimester: Secondary | ICD-10-CM | POA: Diagnosis not present

## 2022-03-29 DIAGNOSIS — Z131 Encounter for screening for diabetes mellitus: Secondary | ICD-10-CM | POA: Diagnosis not present

## 2022-03-29 DIAGNOSIS — Z1329 Encounter for screening for other suspected endocrine disorder: Secondary | ICD-10-CM | POA: Diagnosis not present

## 2022-03-29 DIAGNOSIS — Z113 Encounter for screening for infections with a predominantly sexual mode of transmission: Secondary | ICD-10-CM | POA: Diagnosis not present

## 2022-03-29 LAB — OB RESULTS CONSOLE HEPATITIS B SURFACE ANTIGEN: Hepatitis B Surface Ag: NEGATIVE

## 2022-03-29 LAB — OB RESULTS CONSOLE RUBELLA ANTIBODY, IGM: Rubella: IMMUNE

## 2022-03-29 LAB — OB RESULTS CONSOLE VARICELLA ZOSTER ANTIBODY, IGG: Varicella: IMMUNE

## 2022-03-30 DIAGNOSIS — R051 Acute cough: Secondary | ICD-10-CM | POA: Diagnosis not present

## 2022-03-30 DIAGNOSIS — Z3A11 11 weeks gestation of pregnancy: Secondary | ICD-10-CM | POA: Diagnosis not present

## 2022-03-30 DIAGNOSIS — M791 Myalgia, unspecified site: Secondary | ICD-10-CM | POA: Diagnosis not present

## 2022-05-01 ENCOUNTER — Other Ambulatory Visit: Payer: Self-pay

## 2022-05-01 ENCOUNTER — Emergency Department: Payer: BC Managed Care – PPO

## 2022-05-01 ENCOUNTER — Emergency Department
Admission: EM | Admit: 2022-05-01 | Discharge: 2022-05-02 | Disposition: A | Discharge status: Home / Self Care | Payer: BC Managed Care – PPO | Attending: Emergency Medicine | Admitting: Emergency Medicine

## 2022-05-01 DIAGNOSIS — Z3A17 17 weeks gestation of pregnancy: Secondary | ICD-10-CM | POA: Insufficient documentation

## 2022-05-01 DIAGNOSIS — Z3492 Encounter for supervision of normal pregnancy, unspecified, second trimester: Secondary | ICD-10-CM

## 2022-05-01 DIAGNOSIS — O2692 Pregnancy related conditions, unspecified, second trimester: Secondary | ICD-10-CM | POA: Diagnosis present

## 2022-05-01 DIAGNOSIS — O99281 Endocrine, nutritional and metabolic diseases complicating pregnancy, first trimester: Secondary | ICD-10-CM | POA: Diagnosis not present

## 2022-05-01 DIAGNOSIS — R102 Pelvic and perineal pain: Secondary | ICD-10-CM | POA: Insufficient documentation

## 2022-05-01 DIAGNOSIS — E860 Dehydration: Secondary | ICD-10-CM | POA: Diagnosis not present

## 2022-05-01 LAB — CBC WITH DIFFERENTIAL/PLATELET
Abs Immature Granulocytes: 0.02 10*3/uL (ref 0.00–0.07)
Basophils Absolute: 0 10*3/uL (ref 0.0–0.1)
Basophils Relative: 0 %
Eosinophils Absolute: 0.2 10*3/uL (ref 0.0–0.5)
Eosinophils Relative: 3 %
HCT: 37.8 % (ref 36.0–46.0)
Hemoglobin: 12.9 g/dL (ref 12.0–15.0)
Immature Granulocytes: 0 %
Lymphocytes Relative: 23 %
Lymphs Abs: 1.6 10*3/uL (ref 0.7–4.0)
MCH: 31.5 pg (ref 26.0–34.0)
MCHC: 34.1 g/dL (ref 30.0–36.0)
MCV: 92.2 fL (ref 80.0–100.0)
Monocytes Absolute: 0.5 10*3/uL (ref 0.1–1.0)
Monocytes Relative: 7 %
Neutro Abs: 4.4 10*3/uL (ref 1.7–7.7)
Neutrophils Relative %: 67 %
Platelets: 181 10*3/uL (ref 150–400)
RBC: 4.1 MIL/uL (ref 3.87–5.11)
RDW: 12.3 % (ref 11.5–15.5)
WBC: 6.6 10*3/uL (ref 4.0–10.5)
nRBC: 0 % (ref 0.0–0.2)

## 2022-05-01 LAB — BASIC METABOLIC PANEL
Anion gap: 8 (ref 5–15)
BUN: 9 mg/dL (ref 6–20)
CO2: 20 mmol/L — ABNORMAL LOW (ref 22–32)
Calcium: 8.5 mg/dL — ABNORMAL LOW (ref 8.9–10.3)
Chloride: 105 mmol/L (ref 98–111)
Creatinine, Ser: 0.49 mg/dL (ref 0.44–1.00)
GFR, Estimated: >60 mL/min (ref >60)
Glucose, Bld: 116 mg/dL — ABNORMAL HIGH (ref 70–99)
Potassium: 3.9 mmol/L (ref 3.5–5.1)
Sodium: 133 mmol/L — ABNORMAL LOW (ref 135–145)

## 2022-05-01 LAB — URINALYSIS, ROUTINE W REFLEX MICROSCOPIC
Bilirubin Urine: NEGATIVE
Glucose, UA: 150 mg/dL — AB
Hgb urine dipstick: NEGATIVE
Ketones, ur: 5 mg/dL — AB
Leukocytes,Ua: NEGATIVE
Nitrite: NEGATIVE
Protein, ur: NEGATIVE mg/dL
Specific Gravity, Urine: 1.017 (ref 1.005–1.030)
pH: 6 (ref 5.0–8.0)

## 2022-05-01 LAB — HCG, QUANTITATIVE, PREGNANCY: hCG, Beta Chain, Quant, S: 34941 m[IU]/mL — ABNORMAL HIGH (ref <5)

## 2022-05-01 MED ORDER — SODIUM CHLORIDE 0.9 % IV BOLUS
1000.0000 mL | Freq: Once | INTRAVENOUS | Status: AC
  Administered 2022-05-01: 1000 mL via INTRAVENOUS

## 2022-05-01 NOTE — ED Provider Triage Note (Signed)
Emergency Medicine Provider Triage Evaluation Note  Nicole Mathews , a 21 y.o. female  was evaluated in triage.  Pt complains of abdominal cramping that feels like contractions. No vaginal bleeding. No discharge or gush of fluids. Cramping started about an hour ago.  Physical Exam  Ht 5\' 1"  (1.549 m)   Wt 46.7 kg   BMI 19.46 kg/m  Gen:   Awake, no distress   Resp:  Normal effort  MSK:   Moves extremities without difficulty  Other:    Medical Decision Making  Medically screening exam initiated at 6:22 PM.  Appropriate orders placed.  Nicole Mathews was informed that the remainder of the evaluation will be completed by another provider, this initial triage assessment does not replace that evaluation, and the importance of remaining in the ED until their evaluation is complete.    Nicole Lory, FNP 05/01/22 1823

## 2022-05-01 NOTE — ED Notes (Signed)
This RN assessed FH tones at 145 and strong.

## 2022-05-01 NOTE — ED Provider Notes (Signed)
[facility] Provider Note    Event Date/Time   First MD Initiated Contact with Patient 05/01/22 2305     (approximate)   History   Abdominal Pain   HPI  Nicole Mathews is a 21 y.o. female G3 P2 approximately [redacted] weeks pregnant who presents to the emergency department with a chief complaint of pelvic cramping.  History of preterm delivery x 2 at 35-week 5-day and 36 weeks.  Reports vaginal pain followed by pelvic cramping after sexual intercourse.  Vaginal pain has happened previously after sexual intercourse.  Patient feels like she stood up for too long afterwards that she was attending an event and that is why the cramping moved into her pelvis and abdomen.  Denies associated headache, vision changes, chest pain, shortness of breath, nausea, vomiting, vaginal bleeding or discharge, dysuria, dizziness.  Denies recent fever/chills, cough or congestion.     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   Supervision of normal first teen pregnancy 08/09/2018     Clinic [facility] Prenatal Labs Dating  10 wk [place] Blood type: O/Positive/-- (03/26 1500)  Genetic Screen NIPS:   Normal XX Antibody:Negative (03/26 1500) Anatomic [place]  complete, low lying placenta Resolved on follow up Rubella: 4.25 (03/26 1500) Varicella: non-immune GTT Early:   Not needed     28 wk:  137    RPR: Non Reactive (03/26 1500)  Rhogam Not needed HBsAg: Negative (03/26 1500)  TDaP vaccin     Active Problem List   Patient Active Problem List   Diagnosis Date Noted   Preterm labor 03/10/2021   Supervision of normal pregnancy 09/24/2020     Past Surgical  History   Past Surgical History:  Procedure Laterality Date   ADENOIDECTOMY AND MYRINGOTOMY WITH TUBE PLACEMENT Bilateral    age 18     Home Medications   Prior to Admission medications   Medication Sig Start Date End Date Taking? Authorizing Provider  acetaminophen (TYLENOL) 500 MG tablet Take 2 tablets (1,000 mg total) by mouth every 6 (six) hours as needed (for pain scale < 4). 03/12/21   Chari Manning Lucy Lorena, CNM  benzocaine-Menthol (DERMOPLAST) 20-0.5 % AERO Apply 1 application topically as needed for irritation (perineal discomfort). 03/12/21   Sonny Dandy, CNM  coconut oil OIL Apply 1 application topically as needed. 03/12/21   Sonny Dandy, CNM  dibucaine (NUPERCAINAL) 1 % OINT Place 1 application rectally as needed for hemorrhoids. 03/12/21   Sonny Dandy, CNM  docusate sodium (COLACE) 100 MG capsule Take 1 capsule (100 mg total) by mouth 2 (two) times daily. 03/12/21   Sonny Dandy, CNM  ferrous sulfate 325 (65 FE) MG tablet Take 1 tablet (325 mg total) by mouth 2 (two) times daily with a meal. 03/12/21   Sonny Dandy, CNM  ibuprofen (ADVIL) 600 MG tablet Take 1 tablet (600 mg total) by mouth every 6 (six) hours. 03/12/21   Sonny Dandy, CNM  Prenatal Vit-Fe Fumarate-FA (PRENATAL MULTIVITAMIN) TABS tablet Take 1 tablet by mouth daily at 12 noon.  [provider]  simethicone (MYLICON) 80 MG chewable tablet Chew 1 tablet (80 mg total) by mouth as needed for flatulence. 03/12/21   Sonny Dandy, CNM  witch hazel-glycerin (TUCKS) pad Apply 1 application topically continuous. 03/12/21   Sonny Dandy, CNM     Allergies  Patient has no known allergies.   Family History   Family History  Problem Relation Age of Onset   Lung cancer Maternal Grandfather 21       lung   Diabetes Maternal Grandmother    Endometriosis  Maternal Aunt      Physical Exam  Triage Vital Signs: ED Triage Vitals  Enc Vitals Group     BP 05/01/22 1823 111/76     Pulse Rate 05/01/22 1823 92     Resp 05/01/22 1823 16     Temp 05/01/22 1823 98.2 F (36.8 C)     Temp Source 05/01/22 1823 Oral     SpO2 05/01/22 1823 98 %     Weight 05/01/22 1820 103 lb (46.7 kg)     Height 05/01/22 1820 5\' 1"  (1.549 m)     Head Circumference --      Peak Flow --      Pain Score 05/01/22 1820 10     Pain Loc --      Pain Edu? --      Excl. in GC? --     Updated Vital Signs: BP 90/64   Pulse (!) 101   Temp 98.2 F (36.8 C) (Oral)   Resp 16   Ht 5\' 1"  (1.549 m)   Wt 46.7 kg   SpO2 99%   BMI 19.46 kg/m    General: Awake, no distress.  CV:  RRR.  Good peripheral perfusion.  Resp:  Normal effort.  CTAB. Abd:  Nontender to light or deep palpation.  No distention.  Other:  No truncal vesicles.   ED Results / Procedures / Treatments  Labs (all labs ordered are listed, but only abnormal results are displayed) Labs Reviewed  BASIC METABOLIC PANEL - Abnormal; Notable for the following components:      Result Value   Sodium 133 (*)    CO2 20 (*)    Glucose, Bld 116 (*)    Calcium 8.5 (*)    All other components within normal limits  HCG, QUANTITATIVE, PREGNANCY - Abnormal; Notable for the following components:   hCG, Beta Chain, Quant, S 34,941 (*)    All other components within normal limits  URINALYSIS, ROUTINE W REFLEX MICROSCOPIC - Abnormal; Notable for the following components:   Color, Urine YELLOW (*)    APPearance HAZY (*)    Glucose, UA 150 (*)    Ketones, ur 5 (*)    All other components within normal limits  CBC WITH DIFFERENTIAL/PLATELET     EKG  None   RADIOLOGY I have independently visualized and interpreted patient's ultrasound as well as noted the radiology interpretation:  OB ultrasound: IUP 17 weeks 0-day; no acute abnormality  Official radiology report(s): 05/03/22 OB Limited  Result Date:  05/01/2022 CLINICAL DATA:  Pelvic cramping EXAM: LIMITED OBSTETRIC ULTRASOUND COMPARISON:  None Available. FINDINGS: Number of Fetuses: 1 Heart Rate:  150 bpm Movement: Yes Presentation: Breech Placental Location: Anterior Previa: No Amniotic Fluid (Subjective):  Within normal limits. BPD: 3.59 cm 17 w  0 d MATERNAL FINDINGS: Cervix:  Appears closed. Uterus/Adnexae: No abnormality visualized. IMPRESSION: 1. Single live intrauterine gestation measuring 17 weeks 0 days by BPD.  2. No acute abnormality. This exam is performed on an emergent basis and does not comprehensively evaluate fetal size, dating, or anatomy; follow-up complete OB US should be considered if further fetal assessment is warranted. Electronically Signed   By: Darliss Cheney M.D.   On: 05/01/2022 19:16     PROCEDURES:  Critical Care performed: No  Procedures   MEDICATIONS ORDERED IN ED: Medications  sodium chloride 0.9 % bolus 1,000 mL (1,000 mLs Intravenous New Bag/Given 05/01/22 2259)     IMPRESSION / MDM / ASSESSMENT AND PLAN / ED COURSE  I reviewed the triage vital signs and the nursing notes.                             21 year old G3, P2 approximately [redacted] weeks pregnant by dates presenting with vaginal/pelvic pain after sexual intercourse. Differential diagnosis includes, but is not limited to, ovarian cyst, ovarian torsion, acute appendicitis, diverticulitis, urinary tract infection/pyelonephritis, endometriosis, bowel obstruction, colitis, renal colic, gastroenteritis, hernia, fibroids, endometriosis, pregnancy related pain including ectopic pregnancy, etc. I have personally reviewed patient's records and note a routine prenatal visit on 04/26/2022.  Patient's presentation is most consistent with acute presentation with potential threat to life or bodily function.  Laboratory results notable for normal hemoglobin 12.9, normal electrolytes, ketonuria.  OB ultrasound unremarkable.  IV fluids infusing.  Patient feeling  better and declines Tylenol.  No contractions noted, no vaginal leaking of fluids; no concerns for preterm labor.  Patient will be discharged following completion of IV fluids with close follow-up with her OB.  Strict return precautions given.  Patient verbalizes understanding and agrees with plan of care.      FINAL CLINICAL IMPRESSION(S) / ED DIAGNOSES   Final diagnoses:  Pelvic cramping  Dehydration  Second trimester pregnancy     Rx / DC Orders   ED Discharge Orders     None        Note:  This document was prepared using Dragon voice recognition software and may include unintentional dictation errors.   Irean Hong, MD 05/02/22 [phone]

## 2022-05-01 NOTE — Discharge Instructions (Addendum)
Drink plenty fluids daily.  You may take Tylenol as needed for discomfort.  Return to the ER for worsening symptoms, persistent vomiting, vaginal bleeding or other concerns.

## 2022-05-01 NOTE — ED Triage Notes (Addendum)
Pt is [redacted] weeks pregnant. Followed [facility]. Apt last week was normal. Pt is having abdominal cramping. Third pregnancy. Started as vaginal pains and moved into her abdomen about 30 minutes ago and pain has intensified. Pt denies any bleeding.   Pt also advised she had sex earlier today before this started as well.

## 2022-05-05 ENCOUNTER — Telehealth: Payer: Self-pay | Admitting: Obstetrics and Gynecology

## 2022-05-05 NOTE — Patient Outreach (Signed)
Transition Care Management Follow-up Telephone Call Date of discharge and from where: 05/01/22 from [facility] How have you been since you were released from the hospital? Better now Any questions or concerns? No  Items Reviewed: Did the pt receive and understand the discharge instructions provided? Yes  Medications obtained and verified? Yes  Other? No  Any new allergies since your discharge? Yes  Dietary orders reviewed? No Do you have support at home? Yes   Home Care and Equipment/Supplies: Were home health services ordered? no If so, what is the name of the agency? N/A  Has the agency set up a time to come to the patient's home? not applicable Were any new equipment or medical supplies ordered?  No What is the name of the medical supply agency? N/A Were you able to get the supplies/equipment? not applicable Do you have any questions related to the use of the equipment or supplies? No  Functional Questionnaire: (I = Independent and D = Dependent) ADLs: I  Bathing/Dressing- I  Meal Prep- I  Eating- I  Maintaining continence- I  Transferring/Ambulation- I  Managing Meds- I  Follow up appointments reviewed:  PCP Hospital f/u appt confirmed? Yes  Scheduled to see [facility] on 05/26/22  Specialist Hospital f/u appt confirmed? Has OB appt Are transportation arrangements needed? No  If their condition worsens, is the pt aware to call PCP or go to the Emergency Dept.? Yes Was the patient provided with contact information for the PCP's office or ED? Yes Was to pt encouraged to call back with questions or concerns? Yes

## 2022-05-16 NOTE — L&D Delivery Note (Addendum)
Delivery Note  First Stage: Labor onset: 2100 Augmentation : none Analgesia /Anesthesia intrapartum: epidural SROM at 0654, then forebag ruptured spontaneously @ 0802  Second Stage: Complete dilation at 0752 Onset of pushing at 0802 FHR second stage Category II, moderate variability, fetal heart rate deceleration immediately prior to delivery, resolved with delivery  Delivery of a viable female infant 09/14/2022 at 0809 by Donato Schultz, CNM. delivery of fetal head in OA position with restitution to LOT. No nuchal cord;  Anterior then posterior shoulders delivered easily with gentle downward traction. Baby placed on mom's chest, and attended to by peds.  Cord double clamped after cessation of pulsation, cut by FOB Cord blood sample collected   Third Stage: Placenta delivered Tomasa Blase intact with 3 VC @ [phone] Placenta disposition: patient will take home with her to paint Uterine tone firm / bleeding scant  No laceration identified  Anesthesia for repair: n/a Repair : none needed Est. Blood Loss (mL):  Complications: none  Mom to postpartum.  Baby to Couplet care / Skin to Skin.  Newborn: Birth Weight: TBD, infant skin-to-skin  Apgar Scores: 9, 9 Feeding planned: breastfeeding

## 2022-05-23 ENCOUNTER — Observation Stay
Admission: EM | Admit: 2022-05-23 | Discharge: 2022-05-23 | Disposition: A | Discharge status: Home / Self Care | Payer: BC Managed Care – PPO | Attending: Obstetrics and Gynecology | Admitting: Obstetrics and Gynecology

## 2022-05-23 ENCOUNTER — Other Ambulatory Visit: Payer: Self-pay

## 2022-05-23 DIAGNOSIS — O468X2 Other antepartum hemorrhage, second trimester: Principal | ICD-10-CM | POA: Insufficient documentation

## 2022-05-23 DIAGNOSIS — Z3A19 19 weeks gestation of pregnancy: Secondary | ICD-10-CM | POA: Diagnosis not present

## 2022-05-23 DIAGNOSIS — Z79899 Other long term (current) drug therapy: Secondary | ICD-10-CM | POA: Diagnosis not present

## 2022-05-23 DIAGNOSIS — N898 Other specified noninflammatory disorders of vagina: Secondary | ICD-10-CM | POA: Diagnosis present

## 2022-05-23 LAB — URINALYSIS, COMPLETE (UACMP) WITH MICROSCOPIC
Bilirubin Urine: NEGATIVE
Glucose, UA: NEGATIVE mg/dL
Hgb urine dipstick: NEGATIVE
Ketones, ur: NEGATIVE mg/dL
Leukocytes,Ua: NEGATIVE
Nitrite: NEGATIVE
Protein, ur: NEGATIVE mg/dL
Specific Gravity, Urine: 1.014 (ref 1.005–1.030)
pH: 6 (ref 5.0–8.0)

## 2022-05-23 LAB — WET PREP, GENITAL
Clue Cells Wet Prep HPF POC: NONE SEEN
Sperm: NONE SEEN
Trich, Wet Prep: NONE SEEN
WBC, Wet Prep HPF POC: <10 (ref <10)
Yeast Wet Prep HPF POC: NONE SEEN

## 2022-05-23 LAB — RUPTURE OF MEMBRANE (ROM)PLUS: Rom Plus: NEGATIVE

## 2022-05-23 MED ORDER — ACETAMINOPHEN 500 MG PO TABS: 1000.0000 mg | ORAL_TABLET | Freq: Four times a day (QID) | ORAL | Status: DC | PRN

## 2022-05-23 MED ORDER — CALCIUM CARBONATE ANTACID 500 MG PO CHEW: 2.0000 | CHEWABLE_TABLET | ORAL | Status: DC | PRN

## 2022-05-23 NOTE — OB Triage Note (Signed)
Patient is a  22 yo, G3P2, at 19 weeks 2 days. Patient presents with complaints of leaking of fluid that she states started around 1530 today when the patient noticed a large gush in her underwear. Patient states she may have felt a pop when picking up her eldest child this past weekend.  Patient denies any vaginal bleeding and endorses fetal movement. Patient denies any regular or consistent contractions but reports mild cramping in her lower abdomen. Doppler obtained and FHTs in the 160s. VSS. Mackie CNM notified of patients arrival to unit. Plan to place in observation for dopp tones, ROM plus, wet prep, and UA.

## 2022-05-23 NOTE — Discharge Summary (Signed)
Nicole Mathews is a 22 y.o. female. She is at [redacted]w[redacted]d gestation. Patient's last menstrual period was 12/19/2021. 10/15/2022, by Ultrasound   Prenatal care site: [facility]  Chief complaint: vaginal leaking   HPI: Nicole Mathews presents to L&D with complaints of vaginal leaking.  States she felt a pop 2 days ago when she was picking up her oldest daughter.  Today she noticed a large gush when she went to the bathroom that soaked her under wear.  She denies continued leaking or cramping.  Endorses good fetal movement.   Factors complicating pregnancy: History of preterm birth x 2  S: Resting comfortably. no CTX, no VB.  Active fetal movement.   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    Past Surgical Hx:  has a past surgical history that includes Adenoidectomy and myringotomy with tube placement (Bilateral).   No Known Allergies   Prior to Admission medications   Medication Sig Start Date End Date Taking? Authorizing Provider  Prenatal Vit-Fe Fumarate-FA (PRENATAL MULTIVITAMIN) TABS tablet Take 1 tablet by mouth daily at 12 noon.   Yes [provider]  acetaminophen (TYLENOL) 500 MG tablet Take 2 tablets (1,000 mg total) by mouth every 6 (six) hours as needed (for pain scale < 4). 40/97/35   Ed Blalock, CNM  coconut oil OIL Apply 1 application topically as needed. 32/99/24   Ed Blalock, CNM  dibucaine (NUPERCAINAL) 1 % OINT Place 1 application rectally as needed for hemorrhoids. 26/83/41   Ed Blalock, CNM  docusate sodium (COLACE)  100 MG capsule Take 1 capsule (100 mg total) by mouth 2 (two) times daily. 96/22/29   Ed Blalock, CNM  ferrous sulfate 325 (65 FE) MG tablet Take 1 tablet (325 mg total) by mouth 2 (two) times daily with a meal. 79/89/21   Ed Blalock, CNM  simethicone (MYLICON) 80 MG chewable tablet Chew 1 tablet (80 mg total) by mouth as needed for flatulence. 19/41/74   Ed Blalock, CNM  witch hazel-glycerin (TUCKS) pad Apply 1 application topically continuous. 12/28/46   Ed Blalock, CNM    Social History: She  reports that she has never smoked. She has never used smokeless tobacco. She reports that she does not drink alcohol and does not use drugs.  Family History: family history includes Diabetes in her maternal grandmother; Endometriosis in her maternal aunt; Lung cancer (age of onset: 35) in her maternal grandfather.   Review of Systems: A full review of systems was performed and negative except as noted in the HPI.     Pertinent Results:  Prenatal Labs: Blood type/Rh O POS   Antibody screen Negative    Rubella Immune    Varicella Immune  RPR NR    HBsAg Neg   Hep C NR   HIV NR    GC neg  Chlamydia neg  Genetic screening cfDNA negative     O:  BP (!) 94/55 (BP Location: Left Arm)   Pulse 98   Temp 98.3 F (36.8 C) (Oral)   Resp 18   LMP 12/19/2021  Results for orders placed or performed during the  hospital encounter of 05/23/22 (from the past 48 hour(s))  Wet prep, genital   Collection Time: 05/23/22  4:33 PM  Result Value Ref Range   Yeast Wet Prep HPF POC NONE SEEN NONE SEEN   Trich, Wet Prep NONE SEEN NONE SEEN   Clue Cells Wet Prep HPF POC NONE SEEN NONE SEEN   WBC, Wet Prep HPF POC <10 <10   Sperm NONE SEEN   Urinalysis, Complete w Microscopic   Collection Time: 05/23/22  4:33 PM  Result Value Ref Range   Color, Urine STRAW (A) YELLOW   APPearance CLEAR (A) CLEAR   Specific Gravity, Urine 1.014  1.005 - 1.030   pH 6.0 5.0 - 8.0   Glucose, UA NEGATIVE NEGATIVE mg/dL   Hgb urine dipstick NEGATIVE NEGATIVE   Bilirubin Urine NEGATIVE NEGATIVE   Ketones, ur NEGATIVE NEGATIVE mg/dL   Protein, ur NEGATIVE NEGATIVE mg/dL   Nitrite NEGATIVE NEGATIVE   Leukocytes,Ua NEGATIVE NEGATIVE   WBC, UA 0-5 0 - 5 WBC/hpf   Bacteria, UA RARE (A) NONE SEEN   Squamous Epithelial / HPF 0-5 0 - 5 /HPF   Mucus PRESENT   ROM Plus ([facility] only)   Collection Time: 05/23/22  4:33 PM  Result Value Ref Range   Rom Plus NEGATIVE      Constitutional: NAD, AAOx3  HE/ENT: extraocular movements grossly intact, moist mucous membranes CV: RRR PULM: nl respiratory effort Abd: gravid, non-tender, non-distended, soft  Ext: Non-tender, Nonedmeatous Psych: mood appropriate, speech normal Pelvic : deferred, moderate amount of physiologic discharge  Doppler: 162 bpm, distinguished from maternal pusle   Assessment: 22 y.o. [id number] [redacted]w[redacted]d 10/15/2022, by Ultrasound   Principle diagnosis: Vaginal discharge during pregnancy in second trimester [O26.892, N89.8]   Plan: 1) FHR appropriate for gestational age  -Reassuring fetal status   2) Suspected rupture of membranes not found  -ROM + and wet prep negative  -No continued LOF  -Discussed warning signs to return to L&D triage with   3) Disposition: discharge home stable -Precautions reviewed  -Follow up as scheduled on 05/26/2022 with [facility]    ----- Margaretmary Eddy, CNM Certified Nurse Midwife [facility]  [facility]

## 2022-05-23 NOTE — Progress Notes (Signed)
Discharge instructions provided to patient. Patient verbalized understanding. Pt educated on signs and symptoms of labor, vaginal bleeding, LOF, and fetal movement. Red flag signs reviewed by RN. Patient discharged home in stable condition. 

## 2022-05-26 DIAGNOSIS — Z3482 Encounter for supervision of other normal pregnancy, second trimester: Secondary | ICD-10-CM | POA: Diagnosis not present

## 2022-07-21 DIAGNOSIS — Z3482 Encounter for supervision of other normal pregnancy, second trimester: Secondary | ICD-10-CM | POA: Diagnosis not present

## 2022-08-09 DIAGNOSIS — J029 Acute pharyngitis, unspecified: Secondary | ICD-10-CM | POA: Diagnosis not present

## 2022-08-13 ENCOUNTER — Observation Stay
Admission: EM | Admit: 2022-08-13 | Discharge: 2022-08-13 | Disposition: A | Discharge status: Home / Self Care | Payer: BC Managed Care – PPO | Attending: Obstetrics and Gynecology | Admitting: Obstetrics and Gynecology

## 2022-08-13 DIAGNOSIS — O368130 Decreased fetal movements, third trimester, not applicable or unspecified: Secondary | ICD-10-CM | POA: Diagnosis not present

## 2022-08-13 DIAGNOSIS — O368190 Decreased fetal movements, unspecified trimester, not applicable or unspecified: Secondary | ICD-10-CM | POA: Diagnosis present

## 2022-08-13 DIAGNOSIS — Z79899 Other long term (current) drug therapy: Secondary | ICD-10-CM | POA: Diagnosis not present

## 2022-08-13 DIAGNOSIS — Z3A31 31 weeks gestation of pregnancy: Secondary | ICD-10-CM | POA: Diagnosis not present

## 2022-08-13 DIAGNOSIS — O09213 Supervision of pregnancy with history of pre-term labor, third trimester: Secondary | ICD-10-CM | POA: Diagnosis not present

## 2022-08-13 NOTE — Discharge Summary (Signed)
Nicole Mathews is a 22 y.o. female. She is at [redacted]w[redacted]d gestation. Patient's last menstrual period was 12/19/2021. Estimated Date of Delivery: 10/15/22  Prenatal care site: [facility]  Chief complaint: decreased FM and worsening contractions  HPI: Karlisa presents to L&D with complaints of decreased FM and worsening contractions  Factors complicating pregnancy: Hx of preterm delivery x2 Short interval pregnancies  S: Resting comfortably. no CTX, no VB.no LOF,  Active fetal movement.   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    Past Surgical Hx:  has a past surgical history that includes Adenoidectomy and myringotomy with tube placement (Bilateral).   No Known Allergies   Prior to Admission medications   Medication Sig Start Date End Date Taking? Authorizing Provider  acetaminophen (TYLENOL) 500 MG tablet Take 2 tablets (1,000 mg total) by mouth every 6 (six) hours as needed (for pain scale < 4). [id number]   Ed Blalock, CNM  coconut oil OIL Apply 1 application topically as needed. [id number]   Ed Blalock, CNM  ferrous sulfate 325 (65 FE) MG tablet Take 1 tablet (325 mg total) by mouth 2 (two) times daily with a meal. [id number]   Ed Blalock, CNM  Prenatal Vit-Fe Fumarate-FA (PRENATAL MULTIVITAMIN) TABS tablet Take 1 tablet by mouth daily at 12 noon.    [provider]    Social History: She  reports that she has never smoked. She has never used smokeless tobacco. She reports that she does not drink alcohol and does not use  drugs.  Family History: family history includes Diabetes in her maternal grandmother; Endometriosis in her maternal aunt; Lung cancer (age of onset: 35) in her maternal grandfather. ,no history of gyn cancers  Review of Systems: A full review of systems was performed and negative except as noted in the HPI.    O:  BP 102/65   Pulse 95   Temp 98.5 F (36.9 C) (Oral)   Resp 18   LMP 12/19/2021  No results found for this or any previous visit (from the past 48 hour(s)).   Constitutional: NAD, AAOx3  HE/ENT: extraocular movements grossly intact, moist mucous membranes CV: RRR PULM: nl respiratory effort, CTABL Abd: gravid, non-tender, non-distended, soft  Ext: Non-tender, Nonedmeatous Psych: mood appropriate, speech normal Pelvic :  SVE:   deferred  Fetal Monitor: Baseline: 145 bpm Variability: moderate Accels: Present Decels: none Toco: irregular cramping  Category: I   Assessment: 22 y.o. [redacted]w[redacted]d here for antenatal surveillance during pregnancy.  Principle diagnosis:  There were no encounter diagnoses.   Plan: Labor: not present.  Fetal Wellbeing: Reassuring Cat 1 tracing. Reactive NST  D/c home stable, precautions reviewed, follow-up as scheduled.   ----- Avelino Leeds, CNM Certified Nurse Midwife [facility]

## 2022-08-13 NOTE — OB Triage Note (Signed)
Nicole Mathews 22 y.o. [id number]  presents to Labor & Delivery triage via wheelchair steered by ED staff reporting decreased fetal movement and worsening contractions . She denies signs and symptoms consistent with rupture of membranes or active vaginal bleeding.  External FM and TOCO applied to non-tender abdomen. Initial FHR 145. Vital signs obtained and within normal limits. Patient oriented to care environment including call bell and bed control use. , Corlis Leak notified of patient's arrival @ [address] . Plan to Monitor.

## 2022-09-04 ENCOUNTER — Other Ambulatory Visit: Payer: Self-pay

## 2022-09-04 ENCOUNTER — Encounter: Payer: Self-pay | Admitting: Obstetrics and Gynecology

## 2022-09-04 ENCOUNTER — Observation Stay
Admission: EM | Admit: 2022-09-04 | Discharge: 2022-09-04 | Disposition: A | Discharge status: Home / Self Care | Payer: BC Managed Care – PPO | Attending: Obstetrics and Gynecology | Admitting: Obstetrics and Gynecology

## 2022-09-04 DIAGNOSIS — O479 False labor, unspecified: Secondary | ICD-10-CM | POA: Diagnosis present

## 2022-09-04 DIAGNOSIS — O4703 False labor before 37 completed weeks of gestation, third trimester: Secondary | ICD-10-CM | POA: Diagnosis not present

## 2022-09-04 DIAGNOSIS — Z3A34 34 weeks gestation of pregnancy: Secondary | ICD-10-CM | POA: Insufficient documentation

## 2022-09-04 DIAGNOSIS — O09213 Supervision of pregnancy with history of pre-term labor, third trimester: Secondary | ICD-10-CM | POA: Diagnosis not present

## 2022-09-04 DIAGNOSIS — Z3490 Encounter for supervision of normal pregnancy, unspecified, unspecified trimester: Secondary | ICD-10-CM

## 2022-09-04 LAB — URINALYSIS, COMPLETE (UACMP) WITH MICROSCOPIC
Bilirubin Urine: NEGATIVE
Glucose, UA: NEGATIVE mg/dL
Hgb urine dipstick: NEGATIVE
Ketones, ur: NEGATIVE mg/dL
Leukocytes,Ua: NEGATIVE
Nitrite: NEGATIVE
Protein, ur: NEGATIVE mg/dL
Specific Gravity, Urine: 1.004 — ABNORMAL LOW (ref 1.005–1.030)
pH: 7 (ref 5.0–8.0)

## 2022-09-04 LAB — WET PREP, GENITAL
Clue Cells Wet Prep HPF POC: NONE SEEN
Sperm: NONE SEEN
Trich, Wet Prep: NONE SEEN
WBC, Wet Prep HPF POC: <10 — AB (ref <10)
Yeast Wet Prep HPF POC: NONE SEEN

## 2022-09-04 LAB — FETAL FIBRONECTIN: Fetal Fibronectin: NEGATIVE

## 2022-09-04 MED ORDER — LACTATED RINGERS IV SOLN: INTRAVENOUS | Status: DC

## 2022-09-04 MED ORDER — ACETAMINOPHEN 325 MG PO TABS: 650.0000 mg | ORAL_TABLET | ORAL | Status: DC | PRN

## 2022-09-04 NOTE — OB Triage Note (Signed)
Nicole Mathews 21 y.o. [id number] at 34wk1d  presents to Labor & Delivery triage via wheelchair steered by L&D staff reporting ctx Q5 minutes. She denies signs and symptoms consistent with rupture of membranes or active vaginal bleeding. She reports positive fetal movement and denies headache or blurry vision. External FM and TOCO applied to non-tender abdomen. Initial FHR 135bpm. Vital signs obtained and within normal limits. Patient oriented to care environment including call bell and bed control use. [place], CNM notified of patient's arrival.

## 2022-09-04 NOTE — OB Triage Note (Signed)
Discharge instructions provided to pt. Pt verbalizes understanding. Vaginal bleeding and discharge, contractions, and fetal movement reviewed by RN. Follow-up care reviewed. Pt discharged home with family member.

## 2022-09-06 LAB — OB RESULTS CONSOLE GC/CHLAMYDIA
Chlamydia: NEGATIVE
Neisseria Gonorrhea: NEGATIVE

## 2022-09-06 LAB — OB RESULTS CONSOLE RPR: RPR: NONREACTIVE

## 2022-09-06 LAB — OB RESULTS CONSOLE GBS: GBS: NEGATIVE

## 2022-09-06 LAB — OB RESULTS CONSOLE HIV ANTIBODY (ROUTINE TESTING): HIV: NONREACTIVE

## 2022-09-07 ENCOUNTER — Telehealth: Payer: Self-pay

## 2022-09-07 NOTE — Telephone Encounter (Signed)
..   Medicaid Managed Care   Unsuccessful Outreach Note  09/07/2022 Name: Nicole Mathews MRN: [id number] DOB: 2001/01/04  Referred by: Trey Sailors, PA-C (Inactive) Reason for referral : Appointment   An unsuccessful telephone outreach was attempted today. The patient was referred to the case management team for assistance with care management and care coordination.   Follow Up Plan: A HIPAA compliant phone message was left for the patient providing contact information and requesting a return call.  The care management team will reach out to the patient again over the next 7 days.    Weston Settle Care Guide  [facility] Managed  [facility]  [phone]

## 2022-09-13 ENCOUNTER — Inpatient Hospital Stay
Admission: EM | Admit: 2022-09-13 | Discharge: 2022-09-16 | DRG: 807 | Disposition: A | Discharge status: Home / Self Care | Payer: BC Managed Care – PPO

## 2022-09-13 DIAGNOSIS — Z3A35 35 weeks gestation of pregnancy: Secondary | ICD-10-CM

## 2022-09-13 DIAGNOSIS — Z3490 Encounter for supervision of normal pregnancy, unspecified, unspecified trimester: Secondary | ICD-10-CM

## 2022-09-13 LAB — RUPTURE OF MEMBRANE (ROM)PLUS: Rom Plus: NEGATIVE

## 2022-09-13 NOTE — Discharge Summary (Signed)
Patient ID: Nicole Mathews MRN: [id number] DOB/AGE: [birth year] 21 y.o.  Admit date: 09/04/2022 Discharge date: 09/13/2022  Admission Diagnoses: 22yo G3P2 at [redacted]w[redacted]d presents with UC q72min.  Discharge Diagnoses: UC without active labor  Factors complicating pregnancy: History of preterm delivery x 2 Short interval pregnancies   Prenatal Procedures: NST  Consults: None  Significant Diagnostic Studies:  Results for orders placed or performed during the hospital encounter of 09/04/22 (from the past 336 hour(s))  Wet prep, genital   Collection Time: 09/04/22  1:50 AM  Result Value Ref Range   Yeast Wet Prep HPF POC NONE SEEN NONE SEEN   Trich, Wet Prep NONE SEEN NONE SEEN   Clue Cells Wet Prep HPF POC NONE SEEN NONE SEEN   WBC, Wet Prep HPF POC <10 (A) <10   Sperm NONE SEEN   Urinalysis, Complete w Microscopic -Urine, Clean Catch   Collection Time: 09/04/22  1:50 AM  Result Value Ref Range   Color, Urine STRAW (A) YELLOW   APPearance CLEAR (A) CLEAR   Specific Gravity, Urine 1.004 (L) 1.005 - 1.030   pH 7.0 5.0 - 8.0   Glucose, UA NEGATIVE NEGATIVE mg/dL   Hgb urine dipstick NEGATIVE NEGATIVE   Bilirubin Urine NEGATIVE NEGATIVE   Ketones, ur NEGATIVE NEGATIVE mg/dL   Protein, ur NEGATIVE NEGATIVE mg/dL   Nitrite NEGATIVE NEGATIVE   Leukocytes,Ua NEGATIVE NEGATIVE   RBC / HPF 0-5 0 - 5 RBC/hpf   WBC, UA 0-5 0 - 5 WBC/hpf   Bacteria, UA RARE (A) NONE SEEN   Squamous Epithelial / HPF 0-5 0 - 5 /HPF  Fetal fibronectin   Collection Time: 09/04/22  1:50 AM  Result Value Ref Range   Fetal Fibronectin NEGATIVE NEGATIVE   Treatments: IV hydration  Hospital Course:  This is a 22 y.o. [id number] with IUP at [redacted]w[redacted]d seen for preterm labor evaluation. No leaking of fluid and no bleeding.  Labs noted above, fFN was negative and NST was reactive. She was observed, fetal heart rate monitoring remained reassuring, and she had no signs/symptoms of preterm labor or other maternal-fetal  concerns.  Pt reports UCs resolved.  She was deemed stable for discharge to home with outpatient follow up.  Discharge Physical Exam:  BP 109/75 (BP Location: Left Arm)   Pulse (!) 102   Temp 98.1 F (36.7 C) (Oral)   Resp 18   Ht 5\' 1"  (1.549 m)   Wt 54.4 kg   LMP 12/19/2021   BMI 22.67 kg/m   General: NAD CV: RRR Pulm: nl effort ABD: s/nd/nt, gravid DVT Evaluation: LE non-ttp, no evidence of DVT on exam.  NST: FHR baseline: 140 bpm Variability: moderate Accelerations: yes Decelerations: none Category/reactivity: reactive  TOCO: quiet prior to discharge SVE: deferred      Discharge Condition: Stable  Disposition: Discharge disposition: 01-Home or Self Care        Allergies as of 09/04/2022   No Known Allergies      Medication List     ASK your doctor about these medications    acetaminophen 500 MG tablet Commonly known as: TYLENOL Take 2 tablets (1,000 mg total) by mouth every 6 (six) hours as needed (for pain scale < 4).   Benefiber Drink Mix Pack Take by mouth. Tablespoon per day   coconut oil Oil Apply 1 application topically as needed.   ferrous sulfate 325 (65 FE) MG tablet Take 1 tablet (325 mg total) by mouth 2 (two) times daily with a meal.  prenatal multivitamin Tabs tablet Take 1 tablet by mouth daily at 12 noon.         SignedHaroldine Laws, CNM 09/13/2022 2:18 PM

## 2022-09-14 ENCOUNTER — Encounter: Payer: Self-pay | Admitting: Obstetrics and Gynecology

## 2022-09-14 ENCOUNTER — Inpatient Hospital Stay: Payer: BC Managed Care – PPO | Admitting: Anesthesiology

## 2022-09-14 ENCOUNTER — Other Ambulatory Visit: Payer: Self-pay

## 2022-09-14 DIAGNOSIS — O368130 Decreased fetal movements, third trimester, not applicable or unspecified: Secondary | ICD-10-CM | POA: Diagnosis not present

## 2022-09-14 DIAGNOSIS — Z3A35 35 weeks gestation of pregnancy: Secondary | ICD-10-CM | POA: Diagnosis not present

## 2022-09-14 LAB — TYPE AND SCREEN
ABO/RH(D): O POS
Antibody Screen: NEGATIVE

## 2022-09-14 LAB — CBC
HCT: 35.6 % — ABNORMAL LOW (ref 36.0–46.0)
Hemoglobin: 11.9 g/dL — ABNORMAL LOW (ref 12.0–15.0)
MCH: 31.2 pg (ref 26.0–34.0)
MCHC: 33.4 g/dL (ref 30.0–36.0)
MCV: 93.2 fL (ref 80.0–100.0)
Platelets: 162 10*3/uL (ref 150–400)
RBC: 3.82 MIL/uL — ABNORMAL LOW (ref 3.87–5.11)
RDW: 12.4 % (ref 11.5–15.5)
WBC: 11.4 10*3/uL — ABNORMAL HIGH (ref 4.0–10.5)
nRBC: 0 % (ref 0.0–0.2)

## 2022-09-14 LAB — RPR: RPR Ser Ql: NONREACTIVE

## 2022-09-14 LAB — FETAL FIBRONECTIN: Fetal Fibronectin: POSITIVE — AB

## 2022-09-14 MED ORDER — SODIUM CHLORIDE 0.9 % IV SOLN
INTRAVENOUS | Status: DC | PRN
  Administered 2022-09-14 (×2): 5 mL via EPIDURAL

## 2022-09-14 MED ORDER — BETAMETHASONE SOD PHOS & ACET 6 (3-3) MG/ML IJ SUSP
12.0000 mg | INTRAMUSCULAR | Status: DC
  Administered 2022-09-14: 12 mg via INTRAMUSCULAR
  Filled 2022-09-14: qty 5

## 2022-09-14 MED ORDER — DIPHENHYDRAMINE HCL 50 MG/ML IJ SOLN: 12.5000 mg | INTRAMUSCULAR | Status: DC | PRN

## 2022-09-14 MED ORDER — FENTANYL CITRATE (PF) 100 MCG/2ML IJ SOLN: 50.0000 ug | INTRAMUSCULAR | Status: DC | PRN

## 2022-09-14 MED ORDER — ONDANSETRON HCL 4 MG PO TABS: 4.0000 mg | ORAL_TABLET | ORAL | Status: DC | PRN

## 2022-09-14 MED ORDER — LACTATED RINGERS IV SOLN: 500.0000 mL | INTRAVENOUS | Status: DC | PRN

## 2022-09-14 MED ORDER — PRENATAL MULTIVITAMIN CH
1.0000 | ORAL_TABLET | Freq: Every day | ORAL | Status: DC
  Administered 2022-09-14 – 2022-09-16 (×3): 1 via ORAL
  Filled 2022-09-14 (×4): qty 1

## 2022-09-14 MED ORDER — DIBUCAINE (PERIANAL) 1 % EX OINT: 1.0000 | TOPICAL_OINTMENT | CUTANEOUS | Status: DC | PRN

## 2022-09-14 MED ORDER — MISOPROSTOL 200 MCG PO TABS
ORAL_TABLET | ORAL | Status: AC
  Filled 2022-09-14: qty 4

## 2022-09-14 MED ORDER — DIPHENHYDRAMINE HCL 25 MG PO CAPS: 25.0000 mg | ORAL_CAPSULE | Freq: Four times a day (QID) | ORAL | Status: DC | PRN

## 2022-09-14 MED ORDER — LACTATED RINGERS IV SOLN: 500.0000 mL | Freq: Once | INTRAVENOUS | Status: DC

## 2022-09-14 MED ORDER — ACETAMINOPHEN 325 MG PO TABS
650.0000 mg | ORAL_TABLET | ORAL | Status: DC | PRN
  Administered 2022-09-14 – 2022-09-16 (×4): 650 mg via ORAL
  Filled 2022-09-14 (×4): qty 2

## 2022-09-14 MED ORDER — SENNOSIDES-DOCUSATE SODIUM 8.6-50 MG PO TABS
2.0000 | ORAL_TABLET | Freq: Every day | ORAL | Status: DC
  Administered 2022-09-15 – 2022-09-16 (×2): 2 via ORAL
  Filled 2022-09-14 (×2): qty 2

## 2022-09-14 MED ORDER — SIMETHICONE 80 MG PO CHEW
80.0000 mg | CHEWABLE_TABLET | ORAL | Status: DC | PRN
  Administered 2022-09-16: 80 mg via ORAL
  Filled 2022-09-14: qty 1

## 2022-09-14 MED ORDER — EPHEDRINE 5 MG/ML INJ: 10.0000 mg | INTRAVENOUS | Status: DC | PRN

## 2022-09-14 MED ORDER — OXYTOCIN 10 UNIT/ML IJ SOLN
INTRAMUSCULAR | Status: AC
  Filled 2022-09-14: qty 2

## 2022-09-14 MED ORDER — ONDANSETRON HCL 4 MG/2ML IJ SOLN: 4.0000 mg | Freq: Four times a day (QID) | INTRAMUSCULAR | Status: DC | PRN

## 2022-09-14 MED ORDER — OXYTOCIN BOLUS FROM INFUSION
333.0000 mL | Freq: Once | INTRAVENOUS | Status: AC
  Administered 2022-09-14: 333 mL via INTRAVENOUS

## 2022-09-14 MED ORDER — FENTANYL-BUPIVACAINE-NACL 0.5-0.125-0.9 MG/250ML-% EP SOLN
12.0000 mL/h | EPIDURAL | Status: DC | PRN
  Administered 2022-09-14: 12 mL/h via EPIDURAL

## 2022-09-14 MED ORDER — AMMONIA AROMATIC IN INHA
RESPIRATORY_TRACT | Status: AC
  Filled 2022-09-14: qty 10

## 2022-09-14 MED ORDER — OXYCODONE-ACETAMINOPHEN 5-325 MG PO TABS: 1.0000 | ORAL_TABLET | ORAL | Status: DC | PRN

## 2022-09-14 MED ORDER — ONDANSETRON HCL 4 MG/2ML IJ SOLN: 4.0000 mg | INTRAMUSCULAR | Status: DC | PRN

## 2022-09-14 MED ORDER — ACETAMINOPHEN 325 MG PO TABS: 650.0000 mg | ORAL_TABLET | ORAL | Status: DC | PRN

## 2022-09-14 MED ORDER — FENTANYL-BUPIVACAINE-NACL 0.5-0.125-0.9 MG/250ML-% EP SOLN
EPIDURAL | Status: AC
  Filled 2022-09-14: qty 250

## 2022-09-14 MED ORDER — SOD CITRATE-CITRIC ACID 500-334 MG/5ML PO SOLN: 30.0000 mL | ORAL | Status: DC | PRN

## 2022-09-14 MED ORDER — COCONUT OIL OIL: 1.0000 | TOPICAL_OIL | Status: DC | PRN

## 2022-09-14 MED ORDER — TERBUTALINE SULFATE 1 MG/ML IJ SOLN: 0.2500 mg | INTRAMUSCULAR | Status: DC | PRN

## 2022-09-14 MED ORDER — LIDOCAINE-EPINEPHRINE (PF) 1.5 %-1:200000 IJ SOLN
INTRAMUSCULAR | Status: DC | PRN
  Administered 2022-09-14: 3 mL via EPIDURAL

## 2022-09-14 MED ORDER — TERBUTALINE SULFATE 1 MG/ML IJ SOLN
0.2500 mg | INTRAMUSCULAR | Status: DC | PRN
  Administered 2022-09-14 (×3): 0.25 mg via SUBCUTANEOUS
  Filled 2022-09-14: qty 1

## 2022-09-14 MED ORDER — OXYTOCIN-SODIUM CHLORIDE 30-0.9 UT/500ML-% IV SOLN
2.5000 [IU]/h | INTRAVENOUS | Status: DC
  Filled 2022-09-14: qty 500

## 2022-09-14 MED ORDER — IBUPROFEN 600 MG PO TABS
600.0000 mg | ORAL_TABLET | Freq: Four times a day (QID) | ORAL | Status: DC
  Administered 2022-09-14 – 2022-09-16 (×8): 600 mg via ORAL
  Filled 2022-09-14 (×9): qty 1

## 2022-09-14 MED ORDER — BENZOCAINE-MENTHOL 20-0.5 % EX AERO
1.0000 | INHALATION_SPRAY | CUTANEOUS | Status: DC | PRN
  Filled 2022-09-14: qty 56

## 2022-09-14 MED ORDER — LIDOCAINE HCL (PF) 1 % IJ SOLN: 30.0000 mL | INTRAMUSCULAR | Status: DC | PRN

## 2022-09-14 MED ORDER — PHENYLEPHRINE 80 MCG/ML (10ML) SYRINGE FOR IV PUSH (FOR BLOOD PRESSURE SUPPORT): 80.0000 ug | PREFILLED_SYRINGE | INTRAVENOUS | Status: DC | PRN

## 2022-09-14 MED ORDER — LACTATED RINGERS IV SOLN: INTRAVENOUS | Status: DC

## 2022-09-14 MED ORDER — LIDOCAINE HCL (PF) 1 % IJ SOLN
INTRAMUSCULAR | Status: AC
  Filled 2022-09-14: qty 30

## 2022-09-14 MED ORDER — ZOLPIDEM TARTRATE 5 MG PO TABS: 5.0000 mg | ORAL_TABLET | Freq: Every evening | ORAL | Status: DC | PRN

## 2022-09-14 MED ORDER — WITCH HAZEL-GLYCERIN EX PADS
1.0000 | MEDICATED_PAD | CUTANEOUS | Status: DC | PRN
  Administered 2022-09-14: 1 via TOPICAL
  Filled 2022-09-14 (×2): qty 100

## 2022-09-14 MED ORDER — OXYCODONE-ACETAMINOPHEN 5-325 MG PO TABS: 2.0000 | ORAL_TABLET | ORAL | Status: DC | PRN

## 2022-09-14 MED ORDER — LIDOCAINE HCL (PF) 1 % IJ SOLN
INTRAMUSCULAR | Status: DC | PRN
  Administered 2022-09-14: 2 mL via SUBCUTANEOUS

## 2022-09-14 NOTE — Discharge Summary (Signed)
Obstetrical Discharge Summary  Patient Name: Nicole Mathews DOB: 2000-12-03 MRN: [id number]  Date of Admission: 09/13/2022 Date of Delivery: 09/14/22 Delivered by: Nicole Mathews, CNM Date of Discharge: 09/16/2022  [facility]  WUJ:[id number]'W last menstrual period was 12/19/2021. EDC Estimated Date of Delivery: 10/15/22 Gestational Age at Delivery: [redacted]w[redacted]d   Antepartum complications:  - history of preterm birth x2 - short interval pregnancies  Admitting Diagnosis: preterm labor Secondary Diagnosis: NSVD Patient Active Problem List   Diagnosis Date Noted   Uterine contractions at greater than 20 weeks of gestation 09/04/2022   Pregnancy 09/04/2022   Decreased fetal movement 08/13/2022   Vaginal discharge during pregnancy in second trimester 05/23/2022   Preterm labor 03/10/2021   Supervision of normal pregnancy 09/24/2020   Encounter for supervision of other normal pregnancy, second trimester 09/24/2020    Augmentation: N/A Complications: None Intrapartum complications/course: She arrived with uterine contractions and experienced SROM. She received an epidural and progressed to 10/100/+2 and pushed , delivering viable female infant over intact perineum.  Date of Delivery: 09/14/22 Delivered By: Nicole Mathews, CNM Delivery Type: spontaneous vaginal delivery Anesthesia: epidural Placenta: spontaneous, patient will take it home Laceration: none Episiotomy: none Newborn Data: Live born child "Nicole Mathews" Birth Weight:  5lbs 7.8oz APGAR: 9, 9  Newborn Delivery   Birth date/time: 09/14/2022 08:09:00 Delivery type: Vaginal, Spontaneous     Postpartum Procedures: None Edinburgh:     09/15/2022    9:15 AM 03/11/2021    8:45 AM 03/04/2019    2:00 PM 01/22/2019   11:50 AM  Edinburgh Postnatal Depression Scale Screening Tool  I have been able to laugh and see the funny side of things. 0 0 1 0  I have looked forward with enjoyment to things. 0 0 1 0  I have  blamed myself unnecessarily when things went wrong. 1 0 3 1  I have been anxious or worried for no good reason. 0 0 3 0  I have felt scared or panicky for no good reason. 0 0 0 0  Things have been getting on top of me. 0 0 2 1  I have been so unhappy that I have had difficulty sleeping. 0 0 1 0  I have felt sad or miserable. 0 0 2 0  I have been so unhappy that I have been crying. 0 0 2 0  The thought of harming myself has occurred to me. 0 0 0 0  Edinburgh Postnatal Depression Scale Total 1 0 15 2      Post partum course:  Patient had an uncomplicated postpartum course.  By time of discharge on PPD#2, her pain was controlled on oral pain medications; she had appropriate lochia and was ambulating, voiding without difficulty and tolerating regular diet.  She was deemed stable for discharge to home.    Discharge Physical Exam:  BP 100/75 (BP Location: Left Arm)   Pulse 99   Temp 98.4 F (36.9 C) (Oral)   Resp 20   Ht 5\' 1"  (1.549 m)   Wt 54.9 kg   LMP 12/19/2021   SpO2 99%   Breastfeeding Unknown   BMI 22.86 kg/m   General: NAD CV: RRR Pulm: CTABL, nl effort ABD: s/nd/nt, fundus firm and below the umbilicus Lochia: moderate Perineum: well approximated/intact DVT Evaluation: LE non-ttp, no evidence of DVT on exam.  Hemoglobin  Date Value Ref Range Status  09/15/2022 11.2 (L) 12.0 - 15.0 g/dL Final  29/56/2130 86.5 (L) 11.1 - 15.9 g/dL Final  HCT  Date Value Ref Range Status  09/15/2022 33.5 (L) 36.0 - 46.0 % Final   Hematocrit  Date Value Ref Range Status  11/28/2018 31.0 (L) 34.0 - 46.6 % Final     Disposition: stable, discharge to home. Baby Feeding: breastmilk Baby Disposition: SCN  Rh Immune globulin given: n/a, O pos Rubella vaccine given: immune Varicella vaccine given: immune Tdap vaccine given in AP or PP setting: n/a Flu vaccine given in AP or PP setting: received AP  Contraception: husband to get vasectomy  Prenatal Labs:  Blood type/Rh O pos   Antibody screen neg  Rubella Immune  Varicella Immune  RPR NR  HBsAg Neg  HIV NR  GC neg  Chlamydia neg  Genetic screening negative  1 hour GTT 95  3 hour GTT    GBS neg     Plan:  Nicole Mathews was discharged to home in good condition. Follow-up appointment with delivering provider in 6 weeks.  Discharge Medications: Allergies as of 09/16/2022   No Known Allergies      Medication List     TAKE these medications    acetaminophen 325 MG tablet Commonly known as: Tylenol Take 2 tablets (650 mg total) by mouth every 4 (four) hours as needed (for pain scale < 4). What changed:  medication strength how much to take when to take this   Benefiber Drink Mix Pack Take by mouth. Tablespoon per day   coconut oil Oil Apply 1 application topically as needed.   ferrous sulfate 325 (65 FE) MG tablet Take 1 tablet (325 mg total) by mouth 2 (two) times daily with a meal.   ibuprofen 600 MG tablet Commonly known as: ADVIL Take 1 tablet (600 mg total) by mouth every 6 (six) hours as needed for mild pain or cramping.   prenatal multivitamin Tabs tablet Take 1 tablet by mouth daily at 12 noon.         Follow-up Information     Nicole Mathews, CNM. Schedule an appointment as soon as possible for a visit in 6 week(s).   Specialty: Certified Nurse Midwife Why: postpartum visit Contact information: [address] [place] [address] [phone]                 Signed:  Al Mathews 09/16/2022 11:00 AM  Nicole Mathews, CNM Certified Nurse Midwife [place]  [facility]

## 2022-09-14 NOTE — OB Triage Note (Signed)
Nicole Mathews 21 y.o. [id number]  presents to Labor & Delivery triage via wheelchair steered by ED staff reporting contractions. She states that the contractions began around 9:30 pm and were about 2 minutes apart.  When she arrived to labor and delivery, she felt like her water may have broken also. She denies symptoms of active vaginal bleeding. She states positive fetal movement External FM and TOCO applied to non-tender abdomen. Initial FHR 145. Vital signs obtained and within normal limits. Patient oriented to care environment including call bell and bed control use. Haroldine Laws, CNM notified of patient's arrival.

## 2022-09-14 NOTE — Anesthesia Procedure Notes (Signed)
Epidural Patient location during procedure: OB Start time: 09/14/2022 6:25 AM End time: 09/14/2022 6:30 AM  Staffing Anesthesiologist: Lenard Simmer, MD Performed: anesthesiologist   Preanesthetic Checklist Completed: patient identified, IV checked, site marked, risks and benefits discussed, surgical consent, monitors and equipment checked, pre-op evaluation and timeout performed  Epidural Patient position: sitting Prep: ChloraPrep Patient monitoring: heart rate, continuous pulse ox and blood pressure Approach: midline Location: L3-L4 Injection technique: LOR saline  Needle:  Needle type: Tuohy  Needle gauge: 17 G Needle length: 9 cm Needle insertion depth: 4 cm Catheter type: closed end flexible Catheter size: 19 Gauge Catheter at skin depth: 9 cm Test dose: negative and 1.5% lidocaine with Epi 1:200 K  Assessment Sensory level: T10 Events: blood not aspirated, no cerebrospinal fluid, injection not painful, no injection resistance, no paresthesia and negative IV test  Additional Notes 1st attempt Pt. Evaluated and documentation done after procedure finished. Patient identified. Risks/Benefits/Options discussed with patient including but not limited to bleeding, infection, nerve damage, paralysis, failed block, incomplete pain control, headache, blood pressure changes, nausea, vomiting, reactions to medication both or allergic, itching and postpartum back pain. Confirmed with bedside nurse the patient's most recent platelet count. Confirmed with patient that they are not currently taking any anticoagulation, have any bleeding history or any family history of bleeding disorders. Patient expressed understanding and wished to proceed. All questions were answered. Sterile technique was used throughout the entire procedure. Please see nursing notes for vital signs. Test dose was given through epidural catheter and negative prior to continuing to dose epidural or start infusion. Warning  signs of high block given to the patient including shortness of breath, tingling/numbness in hands, complete motor block, or any concerning symptoms with instructions to call for help. Patient was given instructions on fall risk and not to get out of bed. All questions and concerns addressed with instructions to call with any issues or inadequate analgesia.    Patient tolerated the insertion well without immediate complications.Reason for block:procedure for pain

## 2022-09-14 NOTE — Progress Notes (Signed)
Patient requested IV to come out. Stated it was hurting her. RN explained that if need be, she would have to get another IV placed and patient understood.

## 2022-09-14 NOTE — Anesthesia Preprocedure Evaluation (Signed)
Anesthesia Evaluation  Patient identified by MRN, date of birth, ID band Patient awake    Reviewed: Allergy & Precautions, H&P , NPO status , Patient's Chart, lab work & pertinent test results  History of Anesthesia Complications Negative for: history of anesthetic complications  Airway Mallampati: II  TM Distance: >3 FB Neck ROM: full    Dental no notable dental hx.    Pulmonary neg pulmonary ROS   Pulmonary exam normal        Cardiovascular Exercise Tolerance: Good negative cardio ROS Normal cardiovascular exam     Neuro/Psych  PSYCHIATRIC DISORDERS Anxiety     negative neurological ROS     GI/Hepatic Neg liver ROS,GERD  ,,  Endo/Other  negative endocrine ROS    Renal/GU negative Renal ROS  negative genitourinary   Musculoskeletal   Abdominal   Peds  Hematology  (+) Blood dyscrasia, anemia   Anesthesia Other Findings Past Medical History: No date: Acid reflux No date: Anemia 10/31/2018: Low-lying placenta without hemorrhage 01/21/2019: Premature delivery before 37 weeks 01/21/2019: Preterm premature rupture of membranes (PPROM) with unknown  onset of labor 12/03/2018: Short cervix during pregnancy in third trimester No date: 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     Comment:  during puberty 08/09/2018: Supervision of normal first teen pregnancy     Comment:    [facility] Prenatal Labs Dating  10 wk [place] Blood               type: O/Positive/-- (03/26 1500)  Genetic Screen NIPS:                 Normal XX Antibody:Negative (03/26 1500) Anatomic [place]                complete, low lying placenta Resolved on follow               up Rubella: 4.25 (03/26 1500) Varicella: non-immune               GTT Early:   Not needed     28 wk:  137    RPR: Non               Reactive (03/26 1500)  Rhogam Not needed HBsAg: Negative               (03/26 1500)  TDaP vaccin   Reproductive/Obstetrics (+) Pregnancy                              Anesthesia Physical Anesthesia Plan  ASA: 2  Anesthesia Plan: Epidural   Post-op Pain Management:    Induction:   PONV Risk Score and Plan: 2 and Treatment may vary due to age or medical condition  Airway Management Planned:   Additional Equipment:   Intra-op Plan:   Post-operative Plan:   Informed Consent: I have reviewed the patients History and Physical, chart, labs and discussed the procedure including the risks, benefits and alternatives for the proposed anesthesia with the patient or authorized representative who has indicated his/her understanding and acceptance.       Plan Discussed with: CRNA and Anesthesiologist  Anesthesia Plan Comments:         Anesthesia Quick Evaluation

## 2022-09-14 NOTE — H&P (Signed)
OB History & Physical   History of Present Illness:  Chief Complaint:   HPI:  Nicole Mathews is a 22 y.o. [id number] female at [redacted]w[redacted]d dated by 6 weeks u/s.  She presents to L&D for preterm labor  She reports:  -active fetal movement -no leakage of fluid -no vaginal bleeding -contractions currently every 1-2 minutes  Pregnancy Issues: 1. History of preterm delivery x 2  2. 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   Supervision of normal first teen pregnancy 08/09/2018     [facility] Prenatal Labs Dating  10 wk [place] Blood type: O/Positive/-- (03/26 1500)  Genetic Screen NIPS:   Normal XX Antibody:Negative (03/26 1500) Anatomic [place]  complete, low lying placenta Resolved on follow up Rubella: 4.25 (03/26 1500) Varicella: non-immune GTT Early:   Not needed     28 wk:  137    RPR: Non Reactive (03/26 1500)  Rhogam Not needed HBsAg: Negative (03/26 1500)  TDaP vaccin    Past Surgical History:  Procedure Laterality Date   ADENOIDECTOMY AND MYRINGOTOMY WITH TUBE PLACEMENT Bilateral    age 41    No Known Allergies  Prior to Admission medications   Medication Sig Start Date End Date Taking? Authorizing Provider  acetaminophen (TYLENOL) 500 MG tablet Take 2 tablets (1,000 mg total) by mouth every 6 (six) hours as needed (for pain scale < 4). Patient not taking: Reported on 09/04/2022 03/12/21   Nicole Mathews, CNM  coconut oil OIL Apply 1 application topically as needed. 03/12/21   Nicole Mathews, CNM  ferrous sulfate 325 (65 FE) MG tablet Take 1 tablet (325 mg total) by mouth 2 (two)  times daily with a meal. Patient not taking: Reported on 09/04/2022 03/12/21   Nicole Mathews, CNM  Prenatal Vit-Fe Fumarate-FA (PRENATAL MULTIVITAMIN) TABS tablet Take 1 tablet by mouth daily at 12 noon. Patient not taking: Reported on 09/04/2022    [provider]  Wheat Dextrin (BENEFIBER DRINK MIX) PACK Take by mouth. Tablespoon per day    [provider]     Prenatal care site: [facility]   Social History: She  reports that she has never smoked. She has never used smokeless tobacco. She reports that she does not drink alcohol and does not use drugs.  Family History: family history includes Diabetes in her maternal grandmother; Endometriosis in her maternal aunt; Lung cancer (age of onset: 44) in her maternal grandfather.   Review of Systems: A full review of systems was performed and negative except as noted in the HPI.    Physical Exam:  Vital Signs: LMP 12/19/2021   General:   alert and cooperative  Skin:  normal  Neurologic:    Alert & oriented x 3  Lungs:    Nl effort  Heart:   regular rate and rhythm  Abdomen:  soft, non-tender; bowel sounds normal; no masses,  no organomegaly  Extremities: : non-tender, symmetric, no edema bilaterally.      Results for orders placed or performed during the hospital encounter of 09/13/22 (from the past  24 hour(s))  ROM Plus ([facility] only)     Status: None   Collection Time: 09/13/22 10:57 PM  Result Value Ref Range   Rom Plus NEGATIVE     Pertinent Results:  Prenatal Labs: Blood type/Rh O pos  Antibody screen neg  Rubella Immune  Varicella Immune  RPR NR  HBsAg Neg  HIV NR  GC neg  Chlamydia neg  Genetic screening negative  1 hour GTT 95  3 hour GTT   GBS neg   FHT: FHR: 150 bpm, variability: moderate,  accelerations:  Present,  decelerations:  Present occasional Category/reactivity:  Category I TOCO: regular, every 1-2 minutes SVE: Dilation: 1 / Effacement (%): 70 / Station: -3      Assessment:  Nicole Mathews is a 22 y.o. [id number] female at [redacted]w[redacted]d with preterm labor.   Plan:  1. Admit to Labor & Delivery; consents reviewed and obtained Dr. Feliberto Gottron up dated on patient status and POC  2. Preterm labor - IVF - Terbutaline  - BMX  2. Fetal Well being  - Fetal Tracing: Cat I - GBS neg - Presentation: vtx confirmed by sve   3. Routine OB: - Prenatal labs reviewed, as above - Rh pos - CBC & T&S on admit - Clear fluids, IVF  4. Monitoring of Labor -  Contractions by external toco in place -  Pelvis proven to 2620g -  Plan for continuous fetal monitoring  -  Maternal pain control as desired: IVPM, nitrous, regional anesthesia - Anticipate vaginal delivery  5. Post Partum Planning: - Infant feeding: TBD - Contraception: TBD - Tdap declined  [name], CNM 09/14/2022 12:24 AM

## 2022-09-14 NOTE — Lactation Note (Signed)
This note was copied from a baby's chart. Lactation Consultation Note  Patient Name: Nicole Mathews [id number] Date: 09/14/2022 Age:22 hours  Reason for consult: Follow-up, Late preterm 34-36.6 weeks,mother's request,RN request, Other(Comment) Baby in SCN   Maternal Data  This is mom's 3rd baby, SVD. Mom with history of preterm delivery X2, short interval pregnancies. Mom is an experienced breastfeeding mom. She breastpumped for her first child for 10 months and breastfed her 2nd child for 9 months. She did have mastitis with her 2nd child and then experienced low milk supply .  On follow-up assisted mom with initiation of breastpumping. Mom reports she has used the symphony pump when she had her previous babies that were born late preterm. Mom has hand expressed some measureable amounts of colostrum prior to [facility] entering the room.   Feeding  Mom's choice on admission is breastmilk.    Lactation Tools Discussed/Used DEBP. Reviewed storage and transport of breastmilk, cleaning of parts and pieces.  Interventions  DEBP, education  Discharge Mom has both a Spectra pump and Willow wearable pumps. Discussed with mom using the Spectra double electric pump is recommended to establish her milk supply.  Consult Status Follow-up 09/15/22, inpatient   Update provided to care nurse.    Fuller Song 09/14/2022, 4:34 PM

## 2022-09-15 LAB — CBC
HCT: 33.5 % — ABNORMAL LOW (ref 36.0–46.0)
Hemoglobin: 11.2 g/dL — ABNORMAL LOW (ref 12.0–15.0)
MCH: 31.5 pg (ref 26.0–34.0)
MCHC: 33.4 g/dL (ref 30.0–36.0)
MCV: 94.1 fL (ref 80.0–100.0)
Platelets: 155 10*3/uL (ref 150–400)
RBC: 3.56 MIL/uL — ABNORMAL LOW (ref 3.87–5.11)
RDW: 12.8 % (ref 11.5–15.5)
WBC: 10.1 10*3/uL (ref 4.0–10.5)
nRBC: 0 % (ref 0.0–0.2)

## 2022-09-15 MED ORDER — TETANUS-DIPHTH-ACELL PERTUSSIS 5-2.5-18.5 LF-MCG/0.5 IM SUSY: 0.5000 mL | PREFILLED_SYRINGE | INTRAMUSCULAR | Status: DC | PRN

## 2022-09-15 MED ORDER — OXYCODONE HCL 5 MG PO TABS
5.0000 mg | ORAL_TABLET | ORAL | Status: DC | PRN
  Administered 2022-09-15 – 2022-09-16 (×3): 5 mg via ORAL
  Filled 2022-09-15 (×3): qty 1

## 2022-09-15 NOTE — Progress Notes (Signed)
Post Partum Day 1  Subjective: Doing well, no concerns. Ambulating without difficulty, pain managed with PO meds, tolerating regular diet, and voiding without difficulty.   No fever/chills, chest pain, shortness of breath, nausea/vomiting, or leg pain. No nipple or breast pain. No headache, visual changes, or RUQ/epigastric pain.  Objective: BP 101/67 (BP Location: Right Arm)   Pulse 95   Temp 98.4 F (36.9 C) (Oral)   Resp 20   Ht 5\' 1"  (1.549 m)   Wt 54.9 kg   LMP 12/19/2021   SpO2 99%   Breastfeeding Unknown   BMI 22.86 kg/m    Physical Exam:  General: alert and cooperative Breasts: soft/nontender CV: RRR Pulm: nl effort Abdomen: soft, non-tender Uterine Fundus: firm Incision: n/a Perineum: minimal edema, intact Lochia: appropriate DVT Evaluation: No evidence of DVT seen on physical exam. [place]:     03/11/2021    8:45 AM 03/04/2019    2:00 PM 01/22/2019   11:50 AM  Edinburgh Postnatal Depression Scale Screening Tool  I have been able to laugh and see the funny side of things. 0 1 0  I have looked forward with enjoyment to things. 0 1 0  I have blamed myself unnecessarily when things went wrong. 0 3 1  I have been anxious or worried for no good reason. 0 3 0  I have felt scared or panicky for no good reason. 0 0 0  Things have been getting on top of me. 0 2 1  I have been so unhappy that I have had difficulty sleeping. 0 1 0  I have felt sad or miserable. 0 2 0  I have been so unhappy that I have been crying. 0 2 0  The thought of harming myself has occurred to me. 0 0 0  Edinburgh Postnatal Depression Scale Total 0 15 2     Recent Labs    09/14/22 0415 09/15/22 0635  HGB 11.9* 11.2*  HCT 35.6* 33.5*  WBC 11.4* 10.1  PLT 162 155    Assessment/Plan: 21 y.o. [id number] postpartum day # 1  -Continue routine postpartum care -Lactation consult PRN for breastfeeding   -Immunization status: Needs Tdap prior to discharge  Disposition: Continue inpatient  postpartum care    LOS: 1 day   Bernis Stecher, CNM 09/15/2022, 10:10 AM

## 2022-09-15 NOTE — Anesthesia Postprocedure Evaluation (Signed)
Anesthesia Post Note  Patient: Nicole Mathews  Procedure(s) Performed: AN AD HOC LABOR EPIDURAL  Patient location during evaluation: Mother Baby Anesthesia Type: Epidural Level of consciousness: oriented and awake and alert Pain management: pain level controlled Vital Signs Assessment: post-procedure vital signs reviewed and stable Respiratory status: spontaneous breathing and respiratory function stable Cardiovascular status: blood pressure returned to baseline and stable Postop Assessment: no headache, no backache, no apparent nausea or vomiting and able to ambulate Anesthetic complications: no  No notable events documented.   Last Vitals:  Vitals:   09/15/22 0354 09/15/22 0741  BP: 109/74 101/67  Pulse: 87 95  Resp: 20 20  Temp: 36.5 C 36.9 C  SpO2: 98% 99%    Last Pain:  Vitals:   09/15/22 0741  TempSrc: Oral  PainSc:                  [name]

## 2022-09-16 MED ORDER — IBUPROFEN 600 MG PO TABS: 600.0000 mg | ORAL_TABLET | Freq: Four times a day (QID) | ORAL | 0 refills | Status: AC | PRN

## 2022-09-16 MED ORDER — ACETAMINOPHEN 325 MG PO TABS: 650.0000 mg | ORAL_TABLET | ORAL | Status: AC | PRN

## 2022-09-16 NOTE — Discharge Instructions (Signed)
Vaginal Delivery, Care After Refer to this sheet in the next few weeks. These discharge instructions provide you with information on caring for yourself after delivery. Your caregiver may also give you specific instructions. Your treatment has been planned according to the most current medical practices available, but problems sometimes occur. Call your caregiver if you have any problems or questions after you go home. HOME CARE INSTRUCTIONS Take over-the-counter or prescription medicines only as directed by your caregiver or pharmacist. Do not drink alcohol, especially if you are breastfeeding or taking medicine to relieve pain. Do not smoke tobacco. Continue to use good perineal care. Good perineal care includes: Wiping your perineum from back to front Keeping your perineum clean. You can do sitz baths twice a day, to help keep this area clean Do not use tampons, douche or have sex until your caregiver says it is okay. Shower only and avoid sitting in submerged water, aside from sitz baths Wear a well-fitting bra that provides breast support. Eat healthy foods. Drink enough fluids to keep your urine clear or pale yellow. Eat high-fiber foods such as whole grain cereals and breads, brown rice, beans, and fresh fruits and vegetables every day. These foods may help prevent or relieve constipation. Avoid constipation with high fiber foods or medications, such as miralax or metamucil Follow your caregiver's recommendations regarding resumption of activities such as climbing stairs, driving, lifting, exercising, or traveling. Talk to your caregiver about resuming sexual activities. Resumption of sexual activities is dependent upon your risk of infection, your rate of healing, and your comfort and desire to resume sexual activity. Try to have someone help you with your household activities and your newborn for at least a few days after you leave the hospital. Rest as much as possible. Try to rest or  take a nap when your newborn is sleeping. Increase your activities gradually. Keep all of your scheduled postpartum appointments. It is very important to keep your scheduled follow-up appointments. At these appointments, your caregiver will be checking to make sure that you are healing physically and emotionally. SEEK MEDICAL CARE IF:  You are passing large clots from your vagina. Save any clots to show your caregiver. You have a foul smelling discharge from your vagina. You have trouble urinating. You are urinating frequently. You have pain when you urinate. You have a change in your bowel movements. You have increasing redness, pain, or swelling near your vaginal incision (episiotomy) or vaginal tear. You have pus draining from your episiotomy or vaginal tear. Your episiotomy or vaginal tear is separating. You have painful, hard, or reddened breasts. You have a severe headache. You have blurred vision or see spots. You feel sad or depressed. You have thoughts of hurting yourself or your newborn. You have questions about your care, the care of your newborn, or medicines. You are dizzy or light-headed. You have a rash. You have nausea or vomiting. You were breastfeeding and have not had a menstrual period within 12 weeks after you stopped breastfeeding. You are not breastfeeding and have not had a menstrual period by the 12th week after delivery. You have a fever. SEEK IMMEDIATE MEDICAL CARE IF:  You have persistent pain. You have chest pain. You have shortness of breath. You faint. You have leg pain. You have stomach pain. Your vaginal bleeding saturates two or more sanitary pads in 1 hour. MAKE SURE YOU:  Understand these instructions. Will watch your condition. Will get help right away if you are not doing well or   get worse. Document Released: 04/29/2000 Document Revised: 09/16/2013 Document Reviewed: 12/28/2011 ExitCare Patient Information 2015 [employer]. This  information is not intended to replace advice given to you by your health care provider. Make sure you discuss any questions you have with your health care provider.  Sitz Bath A sitz bath is a warm water bath taken in the sitting position. The water covers only the hips and butt (buttocks). We recommend using one that fits in the toilet, to help with ease of use and cleanliness. It may be used for either healing or cleaning purposes. Sitz baths are also used to relieve pain, itching, or muscle tightening (spasms). The water may contain medicine. Moist heat will help you heal and relax.  HOME CARE  Take 3 to 4 sitz baths a day. Fill the bathtub half-full with warm water. Sit in the water and open the drain a little. Turn on the warm water to keep the tub half-full. Keep the water running constantly. Soak in the water for 15 to 20 minutes. After the sitz bath, pat the affected area dry. GET HELP RIGHT AWAY IF: You get worse instead of better. Stop the sitz baths if you get worse. MAKE SURE YOU: Understand these instructions. Will watch your condition. Will get help right away if you are not doing well or get worse. Document Released: 06/09/2004 Document Revised: 01/25/2012 Document Reviewed: 08/30/2010 ExitCare Patient Information 2015 [employer]. This information is not intended to replace advice given to you by your health care provider. Make sure you discuss any questions you have with your health care provider.   

## 2022-09-16 NOTE — Progress Notes (Signed)
Patient discharged. Discharge instructions given. Patient verbalizes understanding. Transported by staff. 

## 2022-09-19 ENCOUNTER — Telehealth: Payer: Self-pay | Admitting: *Deleted

## 2022-09-19 NOTE — Transitions of Care (Post Inpatient/ED Visit) (Signed)
   09/19/2022  Name: Nicole Mathews MRN: [id number] DOB: [birth year]  Today's TOC FU Call Status: Today's TOC FU Call Status:: Successful TOC FU Call Competed TOC FU Call Complete Date: 09/19/22  Transition Care Management Follow-up Telephone Call Date of Discharge: 09/16/22 Discharge Facility: [facility]) Type of Discharge: Inpatient Admission Primary Inpatient Discharge Diagnosis:: Preterm Labor, NSVD How have you been since you were released from the hospital?: Better Any questions or concerns?: No  Items Reviewed: Did you receive and understand the discharge instructions provided?: Yes Medications obtained,verified, and reconciled?: Yes (Medications Reviewed) Any new allergies since your discharge?: No Dietary orders reviewed?: NA Do you have support at home?: Yes  Medications Reviewed Today: Medications Reviewed Today     Reviewed by Heidi Dach, RN (Registered Nurse) on 09/19/22 at 1200  Med List Status: <None>   Medication Order Taking? Sig Documenting Provider Last Dose Status Informant  acetaminophen (TYLENOL) 325 MG tablet [id number] Yes Take 2 tablets (650 mg total) by mouth every 4 (four) hours as needed (for pain scale < 4). Gustavo Lah, CNM Taking Active   coconut oil OIL [id number] No Apply 1 application topically as needed.  Patient not taking: Reported on 09/19/2022   Chari Manning Rolla Plate, CNM Not Taking Active   ferrous sulfate 325 (65 FE) MG tablet [id number] No Take 1 tablet (325 mg total) by mouth 2 (two) times daily with a meal.  Patient not taking: Reported on 09/04/2022   Chari Manning Rolla Plate, CNM Not Taking Active   ibuprofen (ADVIL) 600 MG tablet [id number] Yes Take 1 tablet (600 mg total) by mouth every 6 (six) hours as needed for mild pain or cramping. Gustavo Lah, CNM Taking Active   Prenatal Vit-Fe Fumarate-FA (PRENATAL MULTIVITAMIN) TABS tablet [id number] Yes Take 1 tablet by mouth daily at 12 noon.  [provider] Taking Active Self  Wheat Dextrin [employer] DRINK MIX) PACK [id number] No Take by mouth. Tablespoon per day  Patient not taking: Reported on 09/19/2022   [provider] Not Taking Active             Home Care and Equipment/Supplies: Were Home Health Services Ordered?: NA Any new equipment or medical supplies ordered?: NA  Functional Questionnaire: Do you need assistance with bathing/showering or dressing?: No Do you need assistance with meal preparation?: No Do you need assistance with eating?: No Do you have difficulty maintaining continence: No Do you need assistance with getting out of bed/getting out of a chair/moving?: No Do you have difficulty managing or taking your medications?: No  Follow up appointments reviewed: PCP Follow-up appointment confirmed?: NA Specialist Hospital Follow-up appointment confirmed?: No Reason Specialist Follow-Up Not Confirmed: Patient has Specialist Provider Number and will Call for Appointment Do you need transportation to your follow-up appointment?: No Do you understand care options if your condition(s) worsen?: Yes-patient verbalized understanding  SDOH Interventions Today    Flowsheet Row Most Recent Value  SDOH Interventions   Transportation Interventions Intervention Not Indicated       Estanislado Emms RN, BSN [place]  Managed [facility] RN Care Coordinator [phone]

## 2022-10-27 ENCOUNTER — Telehealth: Payer: Self-pay

## 2022-10-27 NOTE — Telephone Encounter (Signed)
[facility]- Discharge Call Backs-Spoke to patient on the phone about the following below. 1-Do you have any questions or concerns about yourself as you heal?No 2-Any concerns or questions about your baby?No -gaining weight.  Up to about 9 pounds now. Is your baby eating, peeing,pooping well?Yes 3- Reviewed ABC's of safe sleep. 4-How was your stay at the hospital?Good 5- Did our team work together to care for you?Yes You should be receiving a survey in the mail soon.   We would really appreciate it if you could fill that out for [place] and return it in the mail.  We value the feedback to make improvements and continue the great work we do.   If you have any questions please feel free to call me back at [phone]

## 2022-11-02 DIAGNOSIS — Z1332 Encounter for screening for maternal depression: Secondary | ICD-10-CM | POA: Diagnosis not present

## 2022-11-02 DIAGNOSIS — Z30015 Encounter for initial prescription of vaginal ring hormonal contraceptive: Secondary | ICD-10-CM | POA: Diagnosis not present

## 2023-02-08 DIAGNOSIS — K5909 Other constipation: Secondary | ICD-10-CM | POA: Diagnosis not present

## 2023-02-08 DIAGNOSIS — Z1331 Encounter for screening for depression: Secondary | ICD-10-CM | POA: Diagnosis not present

## 2023-02-08 DIAGNOSIS — Z0000 Encounter for general adult medical examination without abnormal findings: Secondary | ICD-10-CM | POA: Diagnosis not present

## 2023-08-21 DIAGNOSIS — K644 Residual hemorrhoidal skin tags: Secondary | ICD-10-CM | POA: Diagnosis not present

## 2023-08-21 DIAGNOSIS — R5383 Other fatigue: Secondary | ICD-10-CM | POA: Diagnosis not present

## 2023-08-21 DIAGNOSIS — Z800 Family history of malignant neoplasm of digestive organs: Secondary | ICD-10-CM | POA: Diagnosis not present

## 2023-08-21 DIAGNOSIS — K5904 Chronic idiopathic constipation: Secondary | ICD-10-CM | POA: Diagnosis not present

## 2023-08-21 DIAGNOSIS — Z8719 Personal history of other diseases of the digestive system: Secondary | ICD-10-CM | POA: Diagnosis not present

## 2023-11-20 ENCOUNTER — Emergency Department (HOSPITAL_BASED_OUTPATIENT_CLINIC_OR_DEPARTMENT_OTHER)
Admission: EM | Admit: 2023-11-20 | Discharge: 2023-11-20 | Disposition: A | Discharge status: Home / Self Care | Attending: Emergency Medicine | Admitting: Emergency Medicine

## 2023-11-20 ENCOUNTER — Encounter (HOSPITAL_BASED_OUTPATIENT_CLINIC_OR_DEPARTMENT_OTHER): Payer: Self-pay

## 2023-11-20 DIAGNOSIS — N939 Abnormal uterine and vaginal bleeding, unspecified: Secondary | ICD-10-CM | POA: Insufficient documentation

## 2023-11-20 DIAGNOSIS — K625 Hemorrhage of anus and rectum: Secondary | ICD-10-CM

## 2023-11-20 LAB — BASIC METABOLIC PANEL WITH GFR
Anion gap: 9 (ref 5–15)
BUN: 12 mg/dL (ref 6–20)
CO2: 27 mmol/L (ref 22–32)
Calcium: 9.1 mg/dL (ref 8.9–10.3)
Chloride: 104 mmol/L (ref 98–111)
Creatinine, Ser: 0.56 mg/dL (ref 0.44–1.00)
GFR, Estimated: >60 mL/min (ref >60)
Glucose, Bld: 104 mg/dL — ABNORMAL HIGH (ref 70–99)
Potassium: 4.2 mmol/L (ref 3.5–5.1)
Sodium: 140 mmol/L (ref 135–145)

## 2023-11-20 LAB — CBC WITH DIFFERENTIAL/PLATELET
Abs Immature Granulocytes: 0.01 K/uL (ref 0.00–0.07)
Basophils Absolute: 0 K/uL (ref 0.0–0.1)
Basophils Relative: 0 %
Eosinophils Absolute: 0.1 K/uL (ref 0.0–0.5)
Eosinophils Relative: 2 %
HCT: 38.4 % (ref 36.0–46.0)
Hemoglobin: 12.8 g/dL (ref 12.0–15.0)
Immature Granulocytes: 0 %
Lymphocytes Relative: 35 %
Lymphs Abs: 1.9 K/uL (ref 0.7–4.0)
MCH: 31 pg (ref 26.0–34.0)
MCHC: 33.3 g/dL (ref 30.0–36.0)
MCV: 93 fL (ref 80.0–100.0)
Monocytes Absolute: 0.5 K/uL (ref 0.1–1.0)
Monocytes Relative: 9 %
Neutro Abs: 2.9 K/uL (ref 1.7–7.7)
Neutrophils Relative %: 54 %
Platelets: 192 K/uL (ref 150–400)
RBC: 4.13 MIL/uL (ref 3.87–5.11)
RDW: 11.7 % (ref 11.5–15.5)
WBC: 5.4 K/uL (ref 4.0–10.5)
nRBC: 0 % (ref 0.0–0.2)

## 2023-11-20 MED ORDER — DICYCLOMINE HCL 20 MG PO TABS: 20.0000 mg | ORAL_TABLET | Freq: Two times a day (BID) | ORAL | 0 refills | Status: AC

## 2023-11-20 NOTE — ED Notes (Signed)
 Pt states she noticed a large amount of blood after BM today.   Pt states she is not constipated and stool was soft.  Pt has hx of hemorrhoids and ? Fissure Pt does c/o abdomina pain

## 2023-11-20 NOTE — ED Triage Notes (Addendum)
 Pt states she has had rectal bleeding on/off since 2021 Has gastroenterology appt next month Pt states she has been told she has a fissure Had BM tonight and states she noticed a lg amt blood Has had abdominal pain since yesterday

## 2023-11-20 NOTE — ED Provider Notes (Signed)
 [place] EMERGENCY DEPARTMENT AT [facility]  Provider Note  CSN: [id number] Arrival date & time: 11/20/23 2025  History Chief Complaint  Patient presents with   Rectal Bleeding    Nicole Mathews is a 23 y.o. female here with mother for evaluation of rectal bleeding which she has had off and on since 2021. She has occasional abdominal cramping as well. She has not felt constipated. She has seen her PCP for same multiple times and prescribed laxatives but she has only recently been referred to GI. Scheduled to see them in [place] next month. She has not had vomiting or fever.    Home Medications Prior to Admission medications   Medication Sig Start Date End Date Taking? Authorizing Provider  dicyclomine  (BENTYL ) 20 MG tablet Take 1 tablet (20 mg total) by mouth 2 (two) times daily. 11/20/23  Yes Roselyn Carlin NOVAK, MD  acetaminophen  (TYLENOL ) 325 MG tablet Take 2 tablets (650 mg total) by mouth every 4 (four) hours as needed (for pain scale < 4). 09/16/22   Vernel Therisa HERO, CNM  coconut oil OIL Apply 1 application topically as needed. Patient not taking: Reported on 09/19/2022 03/12/21   Dickerson, Felicia, CNM  ferrous sulfate  325 (65 FE) MG tablet Take 1 tablet (325 mg total) by mouth 2 (two) times daily with a meal. Patient not taking: Reported on 09/04/2022 03/12/21   Dickerson, Felicia, CNM  ibuprofen  (ADVIL ) 600 MG tablet Take 1 tablet (600 mg total) by mouth every 6 (six) hours as needed for mild pain or cramping. 09/16/22   Vernel Therisa HERO, CNM  Prenatal Vit-Fe Fumarate-FA (PRENATAL MULTIVITAMIN) TABS tablet Take 1 tablet by mouth daily at 12 noon.    [provider]  Wheat Dextrin (BENEFIBER DRINK MIX) PACK Take by mouth. Tablespoon per day Patient not taking: Reported on 09/19/2022    [provider]     Allergies    Patient has no known allergies.   Review of Systems   Review of Systems Please see HPI for pertinent positives and  negatives  Physical Exam BP (!) 112/90 (BP Location: Right Arm)   Pulse (!) 103   Temp 98.4 F (36.9 C)   Resp 18   Ht 5' 1 (1.549 m)   Wt 48.1 kg   SpO2 100%   Breastfeeding Yes   BMI 20.03 kg/m   Physical Exam Vitals and nursing note reviewed.  Constitutional:      Appearance: Normal appearance.  HENT:     Head: Normocephalic and atraumatic.     Nose: Nose normal.     Mouth/Throat:     Mouth: Mucous membranes are moist.  Eyes:     Extraocular Movements: Extraocular movements intact.     Conjunctiva/sclera: Conjunctivae normal.  Cardiovascular:     Rate and Rhythm: Normal rate.  Pulmonary:     Effort: Pulmonary effort is normal.     Breath sounds: Normal breath sounds.  Abdominal:     General: Abdomen is flat.     Palpations: Abdomen is soft.     Tenderness: There is no abdominal tenderness. There is no guarding.  Genitourinary:    Comments: Patient deferred Musculoskeletal:        General: No swelling. Normal range of motion.     Cervical back: Neck supple.  Skin:    General: Skin is warm and dry.  Neurological:     General: No focal deficit present.     Mental Status: She is alert.  Psychiatric:  Mood and Affect: Mood normal.     ED Results / Procedures / Treatments   EKG None  Procedures Procedures  Medications Ordered in the ED Medications - No data to display  Initial Impression and Plan  Patient here for chronic rectal bleeding, she is concerned for IBD or mass. She reports she has had multiple prior exams and declines rectal exam tonight. She also reports prior US  and CT imaging. She has a benign abdomen tonight. Labs done in triage show normal CBC, BMP. No indication for additional imaging here. She is anxious that her GI appointment is not until next month, will give local referral as well in case they can see her sooner. Will prescribe Bentyl  as needed for abdominal cramping, cautioned for use while breast feeding. Recommend she pump and  dump breast milk if she needs to take the medication. PCP follow up, RTED for any other concerns.    ED Course       MDM Rules/Calculators/A&P Medical Decision Making Problems Addressed: Rectal bleeding: chronic illness or injury with exacerbation, progression, or side effects of treatment  Amount and/or Complexity of Data Reviewed Labs: ordered. Decision-making details documented in ED Course.  Risk Prescription drug management.     Final Clinical Impression(s) / ED Diagnoses Final diagnoses:  Rectal bleeding    Rx / DC Orders ED Discharge Orders          Ordered    dicyclomine  (BENTYL ) 20 MG tablet  2 times daily        11/20/23 2338             Roselyn Carlin NOVAK, MD 11/20/23 [phone]

## 2023-11-22 IMAGING — CT CT ABD-PELV W/ CM
2 of 4 series · 16 of 46 positions shown, 18 images · IV contrast (agent unspecified)
Comparison: No recent relevant priors available at time dictation
for comparison

CLINICAL DATA: One year of left mid and lower abdominal pain

EXAM:
CT ABDOMEN AND PELVIS WITH CONTRAST
TECHNIQUE: Multidetector CT imaging of the abdomen and pelvis was performed
using the standard protocol following bolus administration of
intravenous contrast.

[Series 2: abd pelvis 5.00 · axial · 0.59mm/px · z∈[-1461,-1091]mm · 13 of 82 slices shown, 15 images]
[im 4/82  soft-tissue]
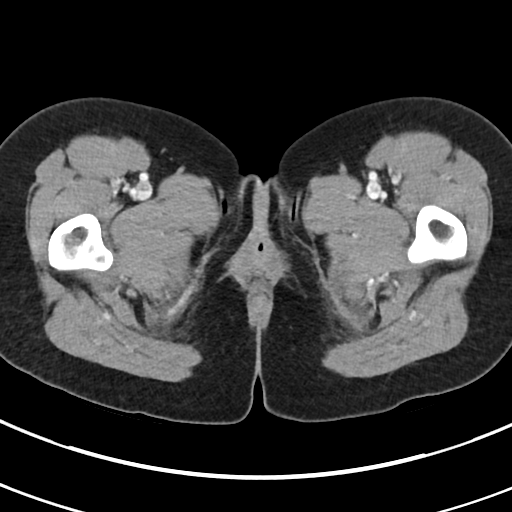
[im 4/82  bone]
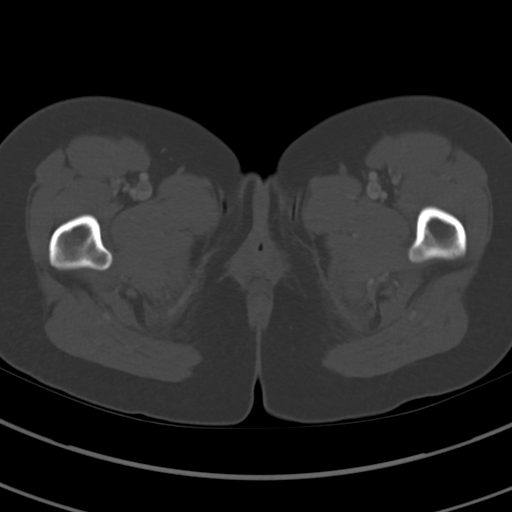
[im 10/82  soft-tissue]
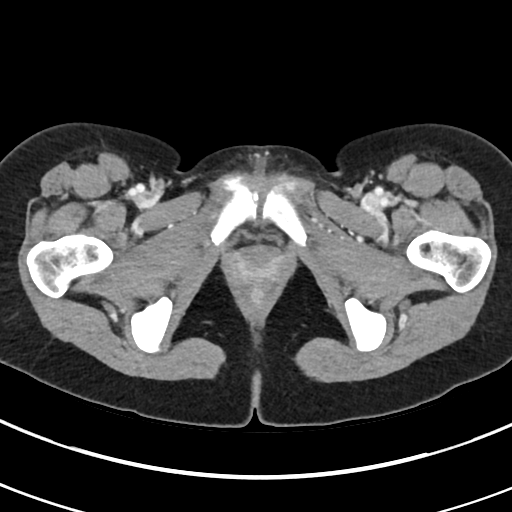
[im 17/82  soft-tissue]
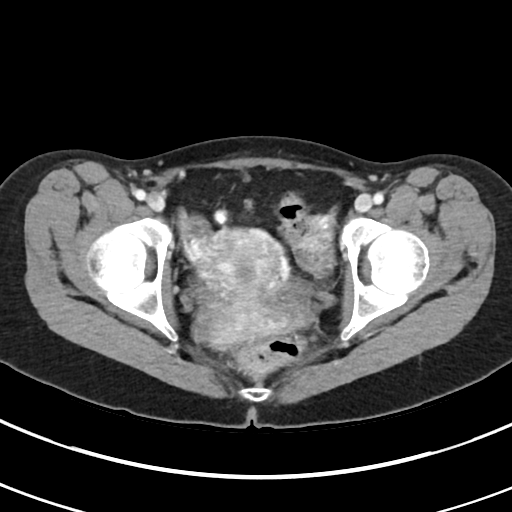
[im 23/82  soft-tissue]
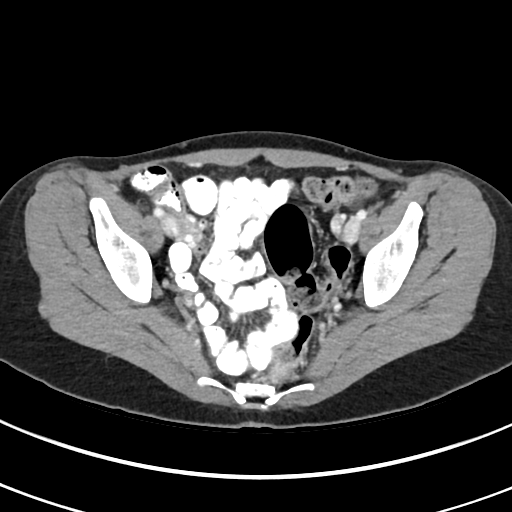
[im 30/82  soft-tissue]
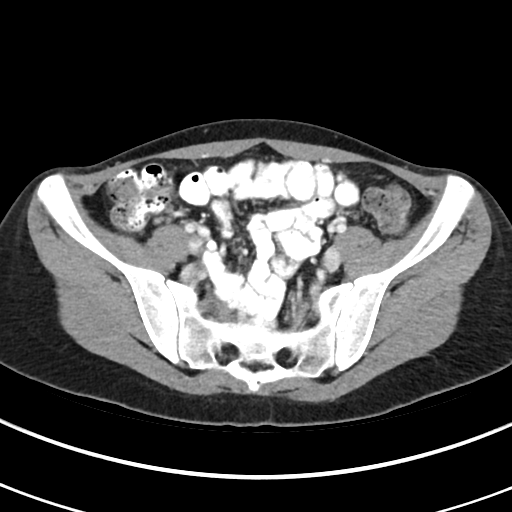
[im 36/82  soft-tissue]
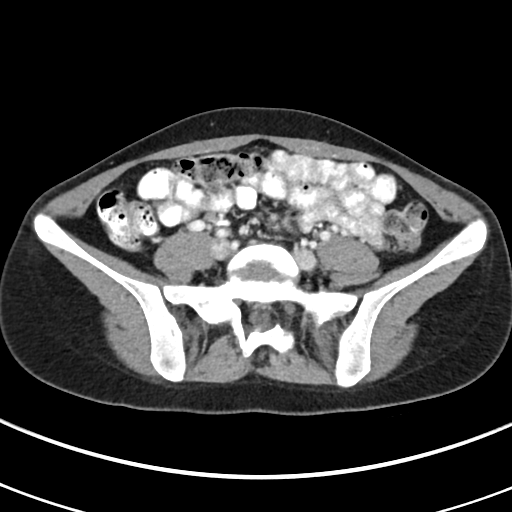
[im 43/82  soft-tissue]
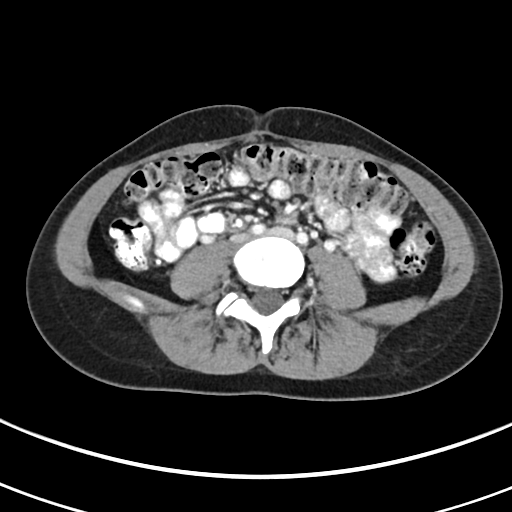
[im 46/82  soft-tissue]
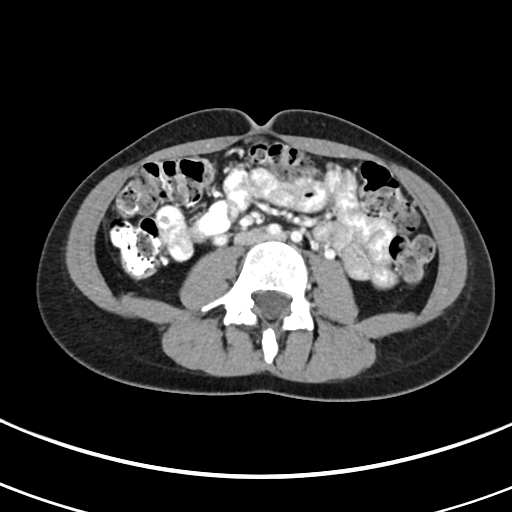
[im 52/82  soft-tissue]
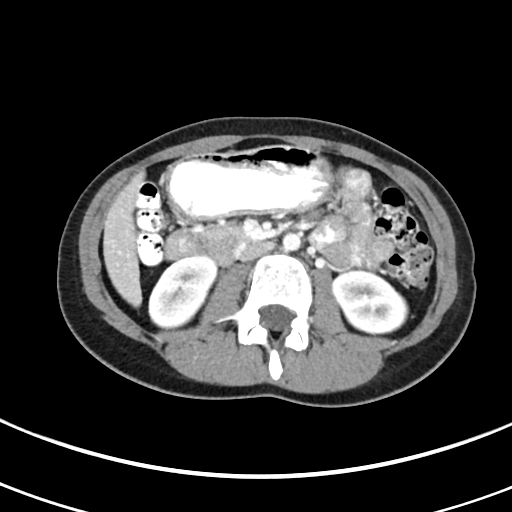
[im 52/82  bone]
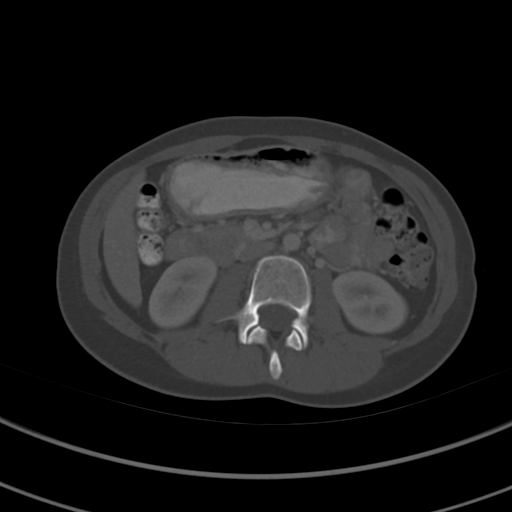
[im 59/82  soft-tissue]
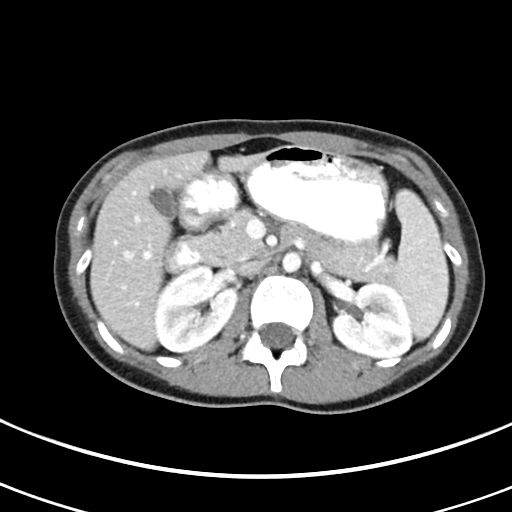
[im 65/82  soft-tissue]
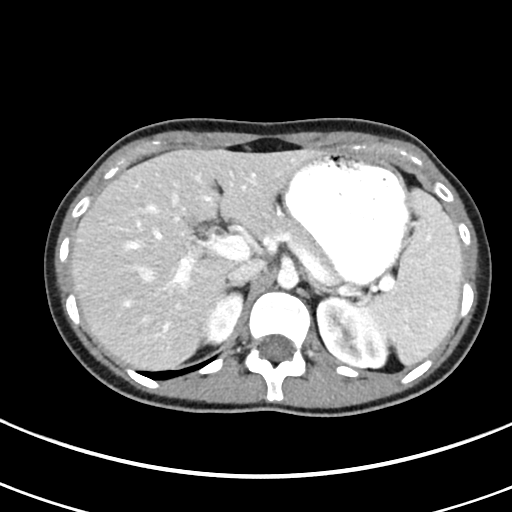
[im 72/82  soft-tissue]
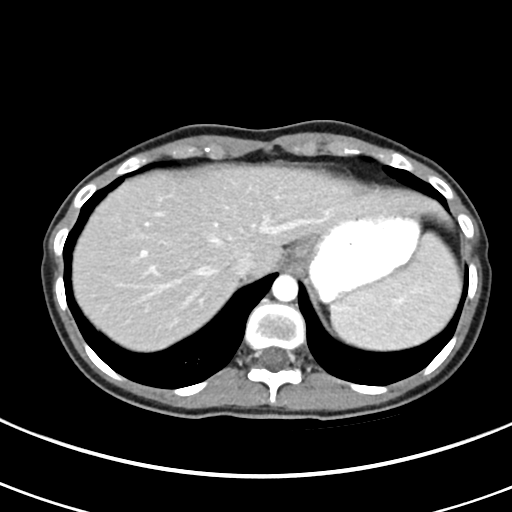
[im 78/82  soft-tissue]
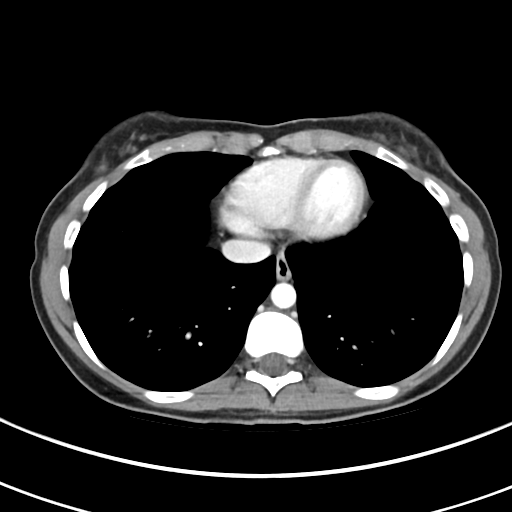

[Series 4: coronals abd pelvis 2.00 cor · coronal · 0.59mm/px · 3 of 99 slices shown]
[im 33/99  soft-tissue]
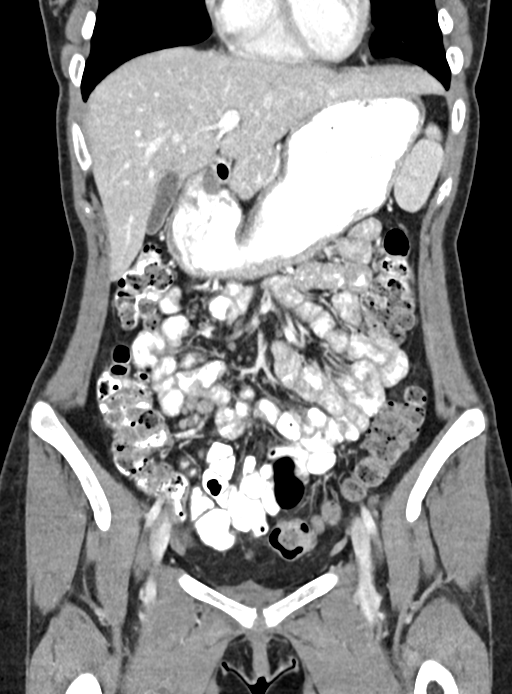
[im 44/99  soft-tissue]
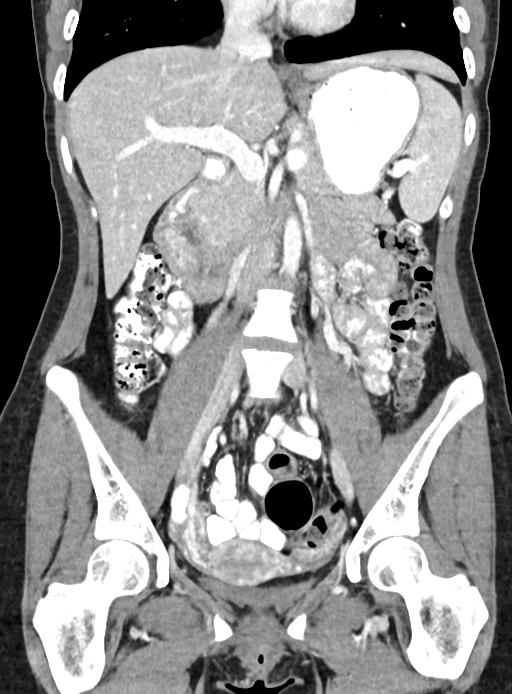
[im 55/99  soft-tissue]
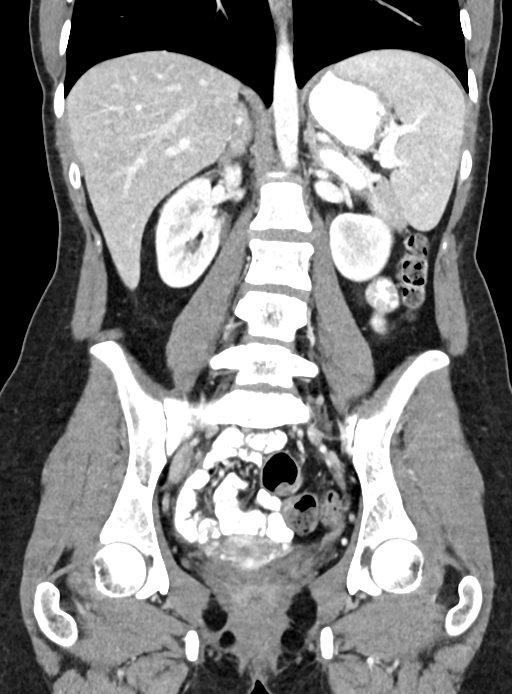

[16 of 46 positions shown; findings below may reference images not displayed]

RADIATION DOSE REDUCTION: This exam was performed according to the
departmental dose-optimization program which includes automated
exposure control, adjustment of the mA and/or kV according to
patient size and/or use of iterative reconstruction technique.

CONTRAST:  80mL OMNIPAQUE IOHEXOL 300 MG/ML  SOLN
FINDINGS: Lower chest: Acute abnormality.

Hepatobiliary: No suspicious hepatic lesion. Gallbladder is
unremarkable. No biliary ductal dilation.

Pancreas: No pancreatic ductal dilation or evidence of acute
inflammation.

Spleen: No splenomegaly or focal splenic lesion.

Adrenals/Urinary Tract: Bilateral adrenal glands appear normal. No
hydronephrosis. No solid enhancing renal mass. Kidneys demonstrate
symmetric enhancement and excretion of contrast material. Urinary
bladder is nondistended limiting evaluation.

Stomach/Bowel: Radiopaque enteric contrast material traverses the
splenic flexure. Stomach is distended with ingested material without
abnormal wall thickening. No pathologic dilation of small or large
bowel. The appendix and terminal ileum appear normal. No evidence of
acute bowel inflammation. Moderate volume of formed stool throughout
the colon suggestive constipation.

Vascular/Lymphatic: Normal caliber aorta. No pathologically enlarged
abdominal or pelvic lymph nodes.

Reproductive: Uterus and bilateral adnexa are unremarkable.

Other: No abdominal wall hernia or abnormality. No abdominopelvic
ascites.

Musculoskeletal: No acute or significant osseous findings.
IMPRESSION: 1. No acute abdominopelvic findings.
2. Moderate volume of formed stool throughout the colon suggestive
of constipation.

## 2023-11-30 DIAGNOSIS — N3001 Acute cystitis with hematuria: Secondary | ICD-10-CM | POA: Diagnosis not present

## 2023-11-30 DIAGNOSIS — R399 Unspecified symptoms and signs involving the genitourinary system: Secondary | ICD-10-CM | POA: Diagnosis not present

## 2023-12-03 DIAGNOSIS — N3001 Acute cystitis with hematuria: Secondary | ICD-10-CM | POA: Diagnosis not present

## 2023-12-03 DIAGNOSIS — Z3202 Encounter for pregnancy test, result negative: Secondary | ICD-10-CM | POA: Diagnosis not present

## 2024-01-02 DIAGNOSIS — R1032 Left lower quadrant pain: Secondary | ICD-10-CM | POA: Diagnosis not present

## 2024-01-02 DIAGNOSIS — K625 Hemorrhage of anus and rectum: Secondary | ICD-10-CM | POA: Diagnosis not present

## 2024-01-02 DIAGNOSIS — R1031 Right lower quadrant pain: Secondary | ICD-10-CM | POA: Diagnosis not present

## 2024-01-03 DIAGNOSIS — Z3202 Encounter for pregnancy test, result negative: Secondary | ICD-10-CM | POA: Diagnosis not present

## 2024-01-03 DIAGNOSIS — S6991XA Unspecified injury of right wrist, hand and finger(s), initial encounter: Secondary | ICD-10-CM | POA: Diagnosis not present

## 2024-01-05 DIAGNOSIS — M546 Pain in thoracic spine: Secondary | ICD-10-CM | POA: Diagnosis not present

## 2024-01-05 DIAGNOSIS — R000 Tachycardia, unspecified: Secondary | ICD-10-CM | POA: Diagnosis not present

## 2024-01-05 DIAGNOSIS — R002 Palpitations: Secondary | ICD-10-CM | POA: Diagnosis not present

## 2024-01-12 DIAGNOSIS — N390 Urinary tract infection, site not specified: Secondary | ICD-10-CM | POA: Diagnosis not present

## 2024-01-12 DIAGNOSIS — R319 Hematuria, unspecified: Secondary | ICD-10-CM | POA: Diagnosis not present

## 2024-01-18 ENCOUNTER — Encounter

## 2024-01-25 ENCOUNTER — Ambulatory Visit

## 2024-01-25 ENCOUNTER — Encounter

## 2024-01-25 DIAGNOSIS — K625 Hemorrhage of anus and rectum: Secondary | ICD-10-CM | POA: Diagnosis not present

## 2024-01-25 DIAGNOSIS — R1032 Left lower quadrant pain: Secondary | ICD-10-CM | POA: Diagnosis not present

## 2024-01-25 DIAGNOSIS — R1031 Right lower quadrant pain: Secondary | ICD-10-CM | POA: Diagnosis not present

## 2024-01-25 DIAGNOSIS — K640 First degree hemorrhoids: Secondary | ICD-10-CM | POA: Diagnosis not present

## 2024-01-31 DIAGNOSIS — M545 Low back pain, unspecified: Secondary | ICD-10-CM | POA: Diagnosis not present

## 2024-01-31 DIAGNOSIS — M546 Pain in thoracic spine: Secondary | ICD-10-CM | POA: Diagnosis not present

## 2024-02-23 DIAGNOSIS — R399 Unspecified symptoms and signs involving the genitourinary system: Secondary | ICD-10-CM | POA: Diagnosis not present

## 2024-02-23 DIAGNOSIS — B9689 Other specified bacterial agents as the cause of diseases classified elsewhere: Secondary | ICD-10-CM | POA: Diagnosis not present

## 2024-02-23 DIAGNOSIS — N760 Acute vaginitis: Secondary | ICD-10-CM | POA: Diagnosis not present

## 2024-03-05 ENCOUNTER — Encounter: Payer: Self-pay | Admitting: Emergency Medicine

## 2024-03-05 ENCOUNTER — Other Ambulatory Visit: Payer: Self-pay

## 2024-03-05 DIAGNOSIS — R1031 Right lower quadrant pain: Secondary | ICD-10-CM | POA: Diagnosis not present

## 2024-03-05 DIAGNOSIS — Z3202 Encounter for pregnancy test, result negative: Secondary | ICD-10-CM | POA: Diagnosis not present

## 2024-03-05 DIAGNOSIS — N8311 Corpus luteum cyst of right ovary: Secondary | ICD-10-CM | POA: Diagnosis not present

## 2024-03-05 LAB — URINALYSIS, ROUTINE W REFLEX MICROSCOPIC
Bilirubin Urine: NEGATIVE
Glucose, UA: NEGATIVE mg/dL
Hgb urine dipstick: NEGATIVE
Ketones, ur: NEGATIVE mg/dL
Leukocytes,Ua: NEGATIVE
Nitrite: NEGATIVE
Protein, ur: NEGATIVE mg/dL
Specific Gravity, Urine: 1.016 (ref 1.005–1.030)
pH: 7 (ref 5.0–8.0)

## 2024-03-05 LAB — COMPREHENSIVE METABOLIC PANEL WITH GFR
ALT: 11 U/L (ref 0–44)
AST: 18 U/L (ref 15–41)
Albumin: 4.4 g/dL (ref 3.5–5.0)
Alkaline Phosphatase: 60 U/L (ref 38–126)
Anion gap: 9 (ref 5–15)
BUN: 13 mg/dL (ref 6–20)
CO2: 27 mmol/L (ref 22–32)
Calcium: 9.2 mg/dL (ref 8.9–10.3)
Chloride: 103 mmol/L (ref 98–111)
Creatinine, Ser: 0.5 mg/dL (ref 0.44–1.00)
GFR, Estimated: >60 mL/min (ref >60)
Glucose, Bld: 105 mg/dL — ABNORMAL HIGH (ref 70–99)
Potassium: 3.8 mmol/L (ref 3.5–5.1)
Sodium: 139 mmol/L (ref 135–145)
Total Bilirubin: 0.5 mg/dL (ref 0.0–1.2)
Total Protein: 6.9 g/dL (ref 6.5–8.1)

## 2024-03-05 LAB — CBC
HCT: 38.4 % (ref 36.0–46.0)
Hemoglobin: 12.8 g/dL (ref 12.0–15.0)
MCH: 30.5 pg (ref 26.0–34.0)
MCHC: 33.3 g/dL (ref 30.0–36.0)
MCV: 91.6 fL (ref 80.0–100.0)
Platelets: 222 K/uL (ref 150–400)
RBC: 4.19 MIL/uL (ref 3.87–5.11)
RDW: 11.8 % (ref 11.5–15.5)
WBC: 5.9 K/uL (ref 4.0–10.5)
nRBC: 0 % (ref 0.0–0.2)

## 2024-03-05 LAB — LIPASE, BLOOD: Lipase: 32 U/L (ref 11–51)

## 2024-03-05 LAB — POC URINE PREG, ED: Preg Test, Ur: NEGATIVE

## 2024-03-05 NOTE — ED Triage Notes (Signed)
 Pt arrives POV referred by [facility], ambulatory to triage, gait steady, no acute distress noted c/o RLQ pain x 2 weeks and today radiation to right lower back, denies n/v/d. LMP 02/14/24.

## 2024-03-06 ENCOUNTER — Emergency Department

## 2024-03-06 ENCOUNTER — Emergency Department
Admission: EM | Admit: 2024-03-06 | Discharge: 2024-03-06 | Disposition: A | Discharge status: Home / Self Care | Attending: Emergency Medicine | Admitting: Emergency Medicine

## 2024-03-06 DIAGNOSIS — N8311 Corpus luteum cyst of right ovary: Secondary | ICD-10-CM | POA: Diagnosis not present

## 2024-03-06 DIAGNOSIS — R1031 Right lower quadrant pain: Secondary | ICD-10-CM

## 2024-03-06 MED ORDER — IOHEXOL 9 MG/ML PO SOLN
500.0000 mL | ORAL | Status: AC
  Administered 2024-03-06: 500 mL via ORAL

## 2024-03-06 MED ORDER — IOHEXOL 300 MG/ML  SOLN
75.0000 mL | Freq: Once | INTRAMUSCULAR | Status: AC | PRN
  Administered 2024-03-06: 75 mL via INTRAVENOUS

## 2024-03-06 MED ORDER — SODIUM CHLORIDE 0.9 % IV BOLUS (SEPSIS)
1000.0000 mL | Freq: Once | INTRAVENOUS | Status: AC
  Administered 2024-03-06: 1000 mL via INTRAVENOUS

## 2024-03-06 NOTE — Discharge Instructions (Addendum)
 You may alternate over the counter Tylenol  1000 mg every 6 hours as needed for pain, fever and Ibuprofen  800 mg every 6-8 hours as needed for pain, fever.  Please take Ibuprofen  with food.  Do not take more than 4000 mg of Tylenol  (acetaminophen ) in a 24 hour period.   You may follow-up with your OB/GYN as needed.

## 2024-03-06 NOTE — ED Provider Notes (Signed)
 [facility] Provider Note    Event Date/Time   First MD Initiated Contact with Patient 03/06/24 0009     (approximate)   History   Abdominal Pain   HPI  Nicole Mathews is a 23 y.o. female with history of anemia, GERD who presents to the emergency department with complaints of right lower quadrant abdominal pain for 2 weeks.  No fevers, chills, vomiting, diarrhea, dysuria, hematuria, vaginal bleeding or discharge.  Was seen urgent care today and was told this could be appendicitis and sent to the emergency department for further evaluation.  Denies history of ovarian cyst.  No prior abdominal surgery.  She denies any history of STIs or concern for sexually transmitted infections.  Sexually active with 1 female partner.   History provided by patient, significant other.    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   Supervision of normal first teen pregnancy 08/09/2018     [facility] Prenatal Labs Dating  10 wk US  Blood type: O/Positive/-- (03/26 1500)  Genetic Screen NIPS:   Normal XX Antibody:Negative (03/26 1500) Anatomic US   complete, low lying placenta Resolved on follow up Rubella: 4.25 (03/26 1500) Varicella: non-immune GTT Early:   Not needed     28 wk:  137    RPR: Non Reactive (03/26 1500)  Rhogam Not needed HBsAg: Negative (03/26 1500)  TDaP vaccin    Past Surgical History:  Procedure Laterality Date   ADENOIDECTOMY AND MYRINGOTOMY WITH TUBE PLACEMENT Bilateral    age 49    MEDICATIONS:  Prior to Admission medications   Medication Sig Start Date End Date Taking? Authorizing Provider  acetaminophen  (TYLENOL ) 325 MG tablet Take 2  tablets (650 mg total) by mouth every 4 (four) hours as needed (for pain scale < 4). 09/16/22   Vernel Therisa HERO, CNM  coconut oil OIL Apply 1 application topically as needed. Patient not taking: Reported on 09/19/2022 03/12/21   Dickerson, Felicia, CNM  dicyclomine  (BENTYL ) 20 MG tablet Take 1 tablet (20 mg total) by mouth 2 (two) times daily. 11/20/23   Roselyn Carlin NOVAK, MD  ferrous sulfate  325 (65 FE) MG tablet Take 1 tablet (325 mg total) by mouth 2 (two) times daily with a meal. Patient not taking: Reported on 09/04/2022 03/12/21   Dickerson, Felicia, CNM  ibuprofen  (ADVIL ) 600 MG tablet Take 1 tablet (600 mg total) by mouth every 6 (six) hours as needed for mild pain or cramping. 09/16/22   Vernel Therisa HERO, CNM  Prenatal Vit-Fe Fumarate-FA (PRENATAL MULTIVITAMIN) TABS tablet Take 1 tablet by mouth daily at 12 noon.    [provider]  Wheat Dextrin (BENEFIBER DRINK MIX) PACK Take by mouth. Tablespoon per day Patient not taking: Reported on 09/19/2022    [provider]    Physical Exam   Triage Vital Signs: ED Triage Vitals  Encounter Vitals Group     BP 03/05/24 1955 109/86     Girls Systolic BP Percentile --      Girls Diastolic BP Percentile --      Boys Systolic BP Percentile --      Boys Diastolic BP Percentile --  Pulse Rate 03/05/24 1955 (!) 109     Resp --      Temp 03/05/24 1955 98.7 F (37.1 C)     Temp Source 03/05/24 1955 Oral     SpO2 03/05/24 1955 100 %     Weight 03/05/24 1954 100 lb (45.4 kg)     Height 03/05/24 1954 5' 1 (1.549 m)     Head Circumference --      Peak Flow --      Pain Score 03/05/24 1954 7     Pain Loc --      Pain Education --      Exclude from Growth Chart --     Most recent vital signs: Vitals:   03/06/24 0230 03/06/24 0300  BP: 99/70 106/65  Pulse: 90 93  Temp:    SpO2: 99% 100%    CONSTITUTIONAL: Alert, responds appropriately to questions. Well-appearing; well-nourished HEAD: Normocephalic, atraumatic EYES:  Conjunctivae clear, pupils appear equal, sclera nonicteric ENT: normal nose; moist mucous membranes NECK: Supple, normal ROM CARD: RRR; S1 and S2 appreciated RESP: Normal chest excursion without splinting or tachypnea; breath sounds clear and equal bilaterally; no wheezes, no rhonchi, no rales, no hypoxia or respiratory distress, speaking full sentences ABD/GI: Non-distended; soft, tender in the right lower quadrant, no guarding or rebound BACK: The back appears normal EXT: Normal ROM in all joints; no deformity noted, no edema SKIN: Normal color for age and race; warm; no rash on exposed skin NEURO: Moves all extremities equally, normal speech PSYCH: The patient's mood and manner are appropriate.   ED Results / Procedures / Treatments   LABS: (all labs ordered are listed, but only abnormal results are displayed) Labs Reviewed  COMPREHENSIVE METABOLIC PANEL WITH GFR - Abnormal; Notable for the following components:      Result Value   Glucose, Bld 105 (*)    All other components within normal limits  URINALYSIS, ROUTINE W REFLEX MICROSCOPIC - Abnormal; Notable for the following components:   Color, Urine YELLOW (*)    APPearance CLOUDY (*)    All other components within normal limits  LIPASE, BLOOD  CBC  POC URINE PREG, ED     EKG:  EKG Interpretation Date/Time:    Ventricular Rate:    PR Interval:    QRS Duration:    QT Interval:    QTC Calculation:   R Axis:      Text Interpretation:           RADIOLOGY: My personal review and interpretation of imaging: Ultrasound shows right corpus luteum cyst.  No torsion.  Normal appendix on CT scan.  I have personally reviewed all radiology reports.   US  PELVIC COMPLETE W TRANSVAGINAL AND TORSION R/O Result Date: 03/06/2024 EXAM: US  Pelvis, Complete Transvaginal and Transabdominal with Doppler 03/06/2024 05:42:00 AM TECHNIQUE: Transabdominal and transvaginal pelvic duplex ultrasound using B-mode/gray scaled imaging with  Doppler spectral analysis and color flow was obtained. COMPARISON: CT abdomen and pelvis from earlier today. CLINICAL HISTORY: Pelvic pain [id number]. FINDINGS: UTERUS: Uterus measures 7.2 x 3.8 x 5.7 cm with a volume of 81.2 cc. Anteverted uterus. Uterus demonstrates normal myometrial echotexture. ENDOMETRIAL STRIPE: Endometrial measures 10 mm in thickness. Endometrial stripe is within normal limits. RIGHT OVARY: Right ovary measures 4.2 x 2.4 x 3.1 cm with a volume of 16.4 cc. Corpus luteum cyst identified within the right ovary. There is normal color Doppler flow and normal arterial and venous waveforms. LEFT OVARY: Left ovary measures 3.5 x 1.4 x  2.3 cm with a volume of 5.9 cc. Left ovary is within normal limits. Normal color Doppler flow and arterial and venous waveforms to the left ovary. FREE FLUID: Small to moderate volume of free fluid noted within the pelvis. IMPRESSION: 1. . No acute findings. No evidence for ovarian mass or torsion. 2. Corpus luteum cyst in the right ovary with normal Doppler flow. No follow-up imaging recommended. 3. Small to moderate volume of free fluid in the pelvis. Electronically signed by: Waddell Calk MD 03/06/2024 05:58 AM EDT RP Workstation: [id number]   CT ABDOMEN PELVIS W CONTRAST Result Date: 03/06/2024 CLINICAL DATA:  Right lower quadrant pain for 2 weeks, initial encounter EXAM: CT ABDOMEN AND PELVIS WITH CONTRAST TECHNIQUE: Multidetector CT imaging of the abdomen and pelvis was performed using the standard protocol following bolus administration of intravenous contrast. RADIATION DOSE REDUCTION: This exam was performed according to the departmental dose-optimization program which includes automated exposure control, adjustment of the mA and/or kV according to patient size and/or use of iterative reconstruction technique. CONTRAST:  75mL OMNIPAQUE  IOHEXOL  300 MG/ML  SOLN COMPARISON:  08/09/2021 FINDINGS: Lower chest: No acute abnormality. Hepatobiliary: No focal liver  abnormality is seen. No gallstones, gallbladder wall thickening, or biliary dilatation. Pancreas: Unremarkable. No pancreatic ductal dilatation or surrounding inflammatory changes. Spleen: Normal in size without focal abnormality. Adrenals/Urinary Tract: Adrenal glands are within normal limits. Kidneys demonstrate a normal enhancement pattern bilaterally. No renal calculi or obstructive changes are seen. The bladder is well distended. Stomach/Bowel: No obstructive or inflammatory changes of the colon are seen. The appendix is air-filled and within normal limits. No inflammatory changes noted. Small bowel and stomach are within normal limits. Vascular/Lymphatic: No significant vascular findings are present. No enlarged abdominal or pelvic lymph nodes. Reproductive: Uterus and bilateral adnexa are unremarkable. Other: No abdominal wall hernia or abnormality. No abdominopelvic ascites. Musculoskeletal: No acute or significant osseous findings. IMPRESSION: No acute abnormality to correspond with the given clinical history. Specifically the appendix is within normal limits. Electronically Signed   By: Oneil Devonshire M.D.   On: 03/06/2024 03:40     PROCEDURES:  Critical Care performed: No    Procedures    IMPRESSION / MDM / ASSESSMENT AND PLAN / ED COURSE  I reviewed the triage vital signs and the nursing notes.    Patient with complaints of right lower quadrant abdominal pain.  The patient is on the cardiac monitor to evaluate for evidence of arrhythmia and/or significant heart rate changes.   DIFFERENTIAL DIAGNOSIS (includes but not limited to):   Appendicitis, colitis, ovarian cyst, TOA, ovarian torsion, ectopic pregnancy, UTI, pyelonephritis, kidney stone, endometriosis   Patient's presentation is most consistent with acute presentation with potential threat to life or bodily function.   PLAN: Labs show no leukocytosis.  Urine shows no sign of infection.  Pregnancy test negative.  She is  tender at McBurney's point but does have a little bit of pelvic tenderness on exam.  Will start with CT scan for further evaluation.  Discussed with patient that appendicitis seems less likely given length of symptoms but due to location of pain I feel CT imaging would be the most appropriate imaging study.  If this is unremarkable, may obtain pelvic ultrasound with Doppler.  She declines anything for pain at this time.  She declines STI screening.   MEDICATIONS GIVEN IN ED: Medications  iohexol  (OMNIPAQUE ) 9 MG/ML oral solution 500 mL ( Oral Canceled Entry 03/06/24 0439)  sodium chloride  0.9 % bolus 1,000 mL (  0 mLs Intravenous Stopped 03/06/24 0136)  iohexol  (OMNIPAQUE ) 300 MG/ML solution 75 mL (75 mLs Intravenous Contrast Given 03/06/24 0319)     ED COURSE: CT scan reviewed and interpreted by myself and the radiologist and shows normal appendix.  Will proceed with transvaginal ultrasound with Doppler to evaluate her right ovary and to rule out torsion.   Ultrasound reviewed and interpreted by myself and radiologist.  No torsion.  She does have a right corpus luteum cyst which is likely the cause of her pain.  Recommend Tylenol , Motrin  as needed.  She has an OB/GYN for follow-up.  I feel she is safe for discharge home.   At this time, I do not feel there is any life-threatening condition present. I reviewed all nursing notes, vitals, pertinent previous records.  All lab and urine results, EKGs, imaging ordered have been independently reviewed and interpreted by myself.  I reviewed all available radiology reports from any imaging ordered this visit.  Based on my assessment, I feel the patient is safe to be discharged home without further emergent workup and can continue workup as an outpatient as needed. Discussed all findings, treatment plan as well as usual and customary return precautions.  They verbalize understanding and are comfortable with this plan.  Outpatient follow-up has been provided as  needed.  All questions have been answered.   CONSULTS:  none   OUTSIDE RECORDS REVIEWED: Reviewed recent PCP notes and urgent care notes.       FINAL CLINICAL IMPRESSION(S) / ED DIAGNOSES   Final diagnoses:  RLQ abdominal pain  Corpus luteum cyst of right ovary     Rx / DC Orders   ED Discharge Orders     None        Note:  This document was prepared using Dragon voice recognition software and may include unintentional dictation errors.   Nishita Isaacks, Josette SAILOR, DO 03/06/24 [id number]

## 2024-03-27 DIAGNOSIS — M545 Low back pain, unspecified: Secondary | ICD-10-CM | POA: Diagnosis not present

## 2024-03-27 DIAGNOSIS — M546 Pain in thoracic spine: Secondary | ICD-10-CM | POA: Diagnosis not present
